# Patient Record
Sex: Female | Born: 1960 | Race: Black or African American | Hispanic: No | Marital: Single | Smoking: Current every day smoker
Health system: Southern US, Community
[De-identification: ages and names within clinical notes are randomized; demographics above are authoritative.]

## PROBLEM LIST (undated history)

## (undated) DIAGNOSIS — K649 Unspecified hemorrhoids: Secondary | ICD-10-CM

## (undated) DIAGNOSIS — A749 Chlamydial infection, unspecified: Secondary | ICD-10-CM

## (undated) HISTORY — PX: OTHER SURGICAL HISTORY: SHX169

---

## 1997-08-06 ENCOUNTER — Emergency Department (HOSPITAL_COMMUNITY): Admission: EM | Admit: 1997-08-06 | Discharge: 1997-08-06 | Payer: Self-pay | Admitting: Emergency Medicine

## 1997-08-17 ENCOUNTER — Inpatient Hospital Stay (HOSPITAL_COMMUNITY): Admission: AD | Admit: 1997-08-17 | Discharge: 1997-08-17 | Payer: Self-pay | Admitting: Obstetrics & Gynecology

## 1997-10-22 ENCOUNTER — Ambulatory Visit (HOSPITAL_COMMUNITY): Admission: RE | Admit: 1997-10-22 | Discharge: 1997-10-22 | Payer: Self-pay | Admitting: Family Medicine

## 1997-11-25 ENCOUNTER — Emergency Department (HOSPITAL_COMMUNITY): Admission: EM | Admit: 1997-11-25 | Discharge: 1997-11-25 | Payer: Self-pay | Admitting: Emergency Medicine

## 1997-11-25 ENCOUNTER — Encounter: Payer: Self-pay | Admitting: Emergency Medicine

## 1998-01-01 ENCOUNTER — Emergency Department (HOSPITAL_COMMUNITY): Admission: EM | Admit: 1998-01-01 | Discharge: 1998-01-02 | Payer: Self-pay | Admitting: Emergency Medicine

## 1998-01-02 ENCOUNTER — Encounter: Payer: Self-pay | Admitting: Emergency Medicine

## 1998-01-24 ENCOUNTER — Encounter: Admission: RE | Admit: 1998-01-24 | Discharge: 1998-04-24 | Payer: Self-pay | Admitting: Family Medicine

## 1998-04-18 ENCOUNTER — Emergency Department (HOSPITAL_COMMUNITY): Admission: EM | Admit: 1998-04-18 | Discharge: 1998-04-18 | Payer: Self-pay | Admitting: Emergency Medicine

## 1998-04-23 ENCOUNTER — Emergency Department (HOSPITAL_COMMUNITY): Admission: EM | Admit: 1998-04-23 | Discharge: 1998-04-23 | Payer: Self-pay | Admitting: Emergency Medicine

## 1998-05-27 ENCOUNTER — Emergency Department (HOSPITAL_COMMUNITY): Admission: EM | Admit: 1998-05-27 | Discharge: 1998-05-27 | Payer: Self-pay | Admitting: Emergency Medicine

## 1999-03-18 ENCOUNTER — Emergency Department (HOSPITAL_COMMUNITY): Admission: EM | Admit: 1999-03-18 | Discharge: 1999-03-18 | Payer: Self-pay | Admitting: Emergency Medicine

## 1999-04-13 ENCOUNTER — Emergency Department (HOSPITAL_COMMUNITY): Admission: EM | Admit: 1999-04-13 | Discharge: 1999-04-13 | Payer: Self-pay | Admitting: Emergency Medicine

## 1999-04-14 ENCOUNTER — Emergency Department (HOSPITAL_COMMUNITY): Admission: EM | Admit: 1999-04-14 | Discharge: 1999-04-14 | Payer: Self-pay | Admitting: Emergency Medicine

## 1999-04-15 ENCOUNTER — Emergency Department (HOSPITAL_COMMUNITY): Admission: EM | Admit: 1999-04-15 | Discharge: 1999-04-15 | Payer: Self-pay | Admitting: Emergency Medicine

## 1999-05-22 ENCOUNTER — Emergency Department (HOSPITAL_COMMUNITY): Admission: EM | Admit: 1999-05-22 | Discharge: 1999-05-22 | Payer: Self-pay | Admitting: Emergency Medicine

## 1999-07-16 ENCOUNTER — Emergency Department (HOSPITAL_COMMUNITY): Admission: EM | Admit: 1999-07-16 | Discharge: 1999-07-16 | Payer: Self-pay | Admitting: Emergency Medicine

## 1999-09-06 ENCOUNTER — Emergency Department (HOSPITAL_COMMUNITY): Admission: EM | Admit: 1999-09-06 | Discharge: 1999-09-07 | Payer: Self-pay

## 1999-09-07 ENCOUNTER — Encounter: Payer: Self-pay | Admitting: Emergency Medicine

## 2000-03-29 ENCOUNTER — Emergency Department (HOSPITAL_COMMUNITY): Admission: EM | Admit: 2000-03-29 | Discharge: 2000-03-29 | Payer: Self-pay | Admitting: Emergency Medicine

## 2001-05-18 ENCOUNTER — Emergency Department (HOSPITAL_COMMUNITY): Admission: EM | Admit: 2001-05-18 | Discharge: 2001-05-18 | Payer: Self-pay | Admitting: Emergency Medicine

## 2001-10-26 ENCOUNTER — Emergency Department (HOSPITAL_COMMUNITY): Admission: EM | Admit: 2001-10-26 | Discharge: 2001-10-26 | Payer: Self-pay | Admitting: *Deleted

## 2001-11-03 ENCOUNTER — Emergency Department (HOSPITAL_COMMUNITY): Admission: EM | Admit: 2001-11-03 | Discharge: 2001-11-03 | Payer: Self-pay | Admitting: Emergency Medicine

## 2001-11-03 ENCOUNTER — Encounter: Payer: Self-pay | Admitting: Emergency Medicine

## 2002-05-15 ENCOUNTER — Emergency Department (HOSPITAL_COMMUNITY): Admission: EM | Admit: 2002-05-15 | Discharge: 2002-05-15 | Payer: Self-pay | Admitting: Emergency Medicine

## 2002-05-15 ENCOUNTER — Encounter: Payer: Self-pay | Admitting: Emergency Medicine

## 2002-07-11 ENCOUNTER — Emergency Department (HOSPITAL_COMMUNITY): Admission: EM | Admit: 2002-07-11 | Discharge: 2002-07-11 | Payer: Self-pay | Admitting: Emergency Medicine

## 2003-10-09 ENCOUNTER — Emergency Department (HOSPITAL_COMMUNITY): Admission: EM | Admit: 2003-10-09 | Discharge: 2003-10-09 | Payer: Self-pay | Admitting: Emergency Medicine

## 2005-01-07 ENCOUNTER — Emergency Department (HOSPITAL_COMMUNITY): Admission: EM | Admit: 2005-01-07 | Discharge: 2005-01-07 | Payer: Self-pay | Admitting: Emergency Medicine

## 2007-06-23 ENCOUNTER — Emergency Department (HOSPITAL_COMMUNITY): Admission: EM | Admit: 2007-06-23 | Discharge: 2007-06-24 | Payer: Self-pay | Admitting: Emergency Medicine

## 2007-06-28 ENCOUNTER — Emergency Department (HOSPITAL_COMMUNITY): Admission: EM | Admit: 2007-06-28 | Discharge: 2007-06-28 | Payer: Self-pay | Admitting: Emergency Medicine

## 2008-04-25 ENCOUNTER — Emergency Department (HOSPITAL_COMMUNITY): Admission: EM | Admit: 2008-04-25 | Discharge: 2008-04-25 | Payer: Self-pay | Admitting: Emergency Medicine

## 2009-04-09 ENCOUNTER — Emergency Department (HOSPITAL_COMMUNITY): Admission: EM | Admit: 2009-04-09 | Discharge: 2009-04-09 | Payer: Self-pay | Admitting: Emergency Medicine

## 2009-07-16 ENCOUNTER — Ambulatory Visit: Payer: Self-pay | Admitting: Internal Medicine

## 2009-07-16 ENCOUNTER — Encounter (INDEPENDENT_AMBULATORY_CARE_PROVIDER_SITE_OTHER): Payer: Self-pay | Admitting: Family Medicine

## 2009-07-16 LAB — CONVERTED CEMR LAB
ALT: 11 units/L (ref 0–35)
AST: 12 units/L (ref 0–37)
Alkaline Phosphatase: 54 units/L (ref 39–117)
Basophils Absolute: 0 10*3/uL (ref 0.0–0.1)
Basophils Relative: 1 % (ref 0–1)
CRP: 0.6 mg/dL — ABNORMAL HIGH (ref <0.6)
Calcium: 8.8 mg/dL (ref 8.4–10.5)
HCT: 41 % (ref 36.0–46.0)
Hemoglobin: 14 g/dL (ref 12.0–15.0)
Lymphs Abs: 3 10*3/uL (ref 0.7–4.0)
MCV: 87.6 fL (ref 78.0–100.0)
Monocytes Relative: 11 % (ref 3–12)
Neutro Abs: 1.5 10*3/uL — ABNORMAL LOW (ref 1.7–7.7)
Neutrophils Relative %: 28 % — ABNORMAL LOW (ref 43–77)
TSH: 2.588 microintl units/mL (ref 0.350–4.500)
Total Bilirubin: 0.2 mg/dL — ABNORMAL LOW (ref 0.3–1.2)
Total Protein: 6.9 g/dL (ref 6.0–8.3)
Vit D, 25-Hydroxy: 16 ng/mL — ABNORMAL LOW (ref 30–89)
WBC: 5.2 10*3/uL (ref 4.0–10.5)

## 2009-07-24 ENCOUNTER — Encounter: Admission: RE | Admit: 2009-07-24 | Discharge: 2009-07-24 | Payer: Self-pay | Admitting: Family Medicine

## 2010-04-06 ENCOUNTER — Encounter: Payer: Self-pay | Admitting: Family Medicine

## 2010-07-29 NOTE — Op Note (Signed)
NAME:  Joanna Phelps, PETREY NO.:  [id number]   MEDICAL RECORD NO.:  [id number]          PATIENT TYPE:  EMS   LOCATION:  ED                           FACILITY:  [facility]   PHYSICIAN:  Thornton Park. Daphine Deutscher, MD  DATE OF BIRTH:  05-Jul-1960   DATE OF PROCEDURE:  06/28/2007  DATE OF DISCHARGE:  06/28/2007                               OPERATIVE REPORT   PREOPERATIVE DIAGNOSIS:  Left perirectal abscess.   POSTOPERATIVE DIAGNOSIS:  Left perirectal abscess.   The patient was seen by me in the ED with [place], PA.  The area was  prepped the left side.  It was a fluctuant area sort of anterior.  This  was painted with Betadine and injected with lidocaine and incised about  1.5 cm in length.  A copious amount of foul-smelling pus escaped.  The  patient will be placed on Augmentin and will be given something for pain  and I offered to see her back in the office.   IMPRESSION:  Perirectal abscess, status post incision and drainage.  Return to the office in about a week.  She will be given Augmentin and  something for pain.      Thornton Park Daphine Deutscher, MD  Electronically Signed     MBM/MEDQ  D:  06/28/2007  T:  06/29/2007  Job:  [id number]

## 2010-11-29 ENCOUNTER — Emergency Department (HOSPITAL_COMMUNITY)
Admission: EM | Admit: 2010-11-29 | Discharge: 2010-11-29 | Disposition: A | Discharge status: Home / Self Care | Payer: Self-pay | Attending: Emergency Medicine | Admitting: Emergency Medicine

## 2010-11-29 DIAGNOSIS — R002 Palpitations: Secondary | ICD-10-CM | POA: Insufficient documentation

## 2010-11-29 DIAGNOSIS — R0602 Shortness of breath: Secondary | ICD-10-CM | POA: Insufficient documentation

## 2010-11-29 LAB — POCT I-STAT, CHEM 8
Calcium, Ion: 1.15 mmol/L (ref 1.12–1.32)
Chloride: 105 mEq/L (ref 96–112)
Creatinine, Ser: 0.9 mg/dL (ref 0.50–1.10)
HCT: 44 % (ref 36.0–46.0)
Hemoglobin: 15 g/dL (ref 12.0–15.0)
Potassium: 4 mEq/L (ref 3.5–5.1)
Sodium: 139 mEq/L (ref 135–145)
TCO2: 21 mmol/L (ref 0–100)

## 2010-11-29 LAB — CBC
HCT: 39.2 % (ref 36.0–46.0)
Hemoglobin: 13.5 g/dL (ref 12.0–15.0)
MCH: 29.1 pg (ref 26.0–34.0)
RBC: 4.64 MIL/uL (ref 3.87–5.11)
RDW: 14.7 % (ref 11.5–15.5)
WBC: 4.9 10*3/uL (ref 4.0–10.5)

## 2010-11-29 LAB — POCT I-STAT TROPONIN I: Troponin i, poc: 0 ng/mL (ref 0.00–0.08)

## 2010-11-29 LAB — DIFFERENTIAL
Basophils Relative: 1 % (ref 0–1)
Eosinophils Relative: 2 % (ref 0–5)

## 2010-11-29 LAB — D-DIMER, QUANTITATIVE: D-Dimer, Quant: 0.27 ug/mL-FEU (ref 0.00–0.48)

## 2011-03-09 ENCOUNTER — Encounter: Payer: Self-pay | Admitting: Emergency Medicine

## 2011-03-09 ENCOUNTER — Other Ambulatory Visit: Payer: Self-pay

## 2011-03-09 ENCOUNTER — Emergency Department (HOSPITAL_COMMUNITY)
Admission: EM | Admit: 2011-03-09 | Discharge: 2011-03-09 | Disposition: A | Discharge status: Home / Self Care | Payer: Self-pay | Attending: Emergency Medicine | Admitting: Emergency Medicine

## 2011-03-09 DIAGNOSIS — I493 Ventricular premature depolarization: Secondary | ICD-10-CM

## 2011-03-09 DIAGNOSIS — R739 Hyperglycemia, unspecified: Secondary | ICD-10-CM

## 2011-03-09 DIAGNOSIS — R7309 Other abnormal glucose: Secondary | ICD-10-CM | POA: Insufficient documentation

## 2011-03-09 DIAGNOSIS — I4949 Other premature depolarization: Secondary | ICD-10-CM | POA: Insufficient documentation

## 2011-03-09 DIAGNOSIS — R002 Palpitations: Secondary | ICD-10-CM | POA: Insufficient documentation

## 2011-03-09 DIAGNOSIS — E876 Hypokalemia: Secondary | ICD-10-CM | POA: Insufficient documentation

## 2011-03-09 LAB — BASIC METABOLIC PANEL
CO2: 22 mEq/L (ref 19–32)
Calcium: 8.8 mg/dL (ref 8.4–10.5)
GFR calc Af Amer: 90 mL/min — ABNORMAL LOW (ref >90)
Glucose, Bld: 148 mg/dL — ABNORMAL HIGH (ref 70–99)

## 2011-03-09 MED ORDER — POTASSIUM CHLORIDE CRYS ER 20 MEQ PO TBCR
40.0000 meq | EXTENDED_RELEASE_TABLET | Freq: Once | ORAL | Status: AC
  Administered 2011-03-09: 40 meq via ORAL
  Filled 2011-03-09: qty 2

## 2011-03-09 MED ORDER — POTASSIUM CHLORIDE CRYS ER 20 MEQ PO TBCR: 20.0000 meq | EXTENDED_RELEASE_TABLET | Freq: Two times a day (BID) | ORAL | Status: DC

## 2011-03-09 MED ORDER — POTASSIUM CHLORIDE ER 10 MEQ PO TBCR: 20.0000 meq | EXTENDED_RELEASE_TABLET | Freq: Two times a day (BID) | ORAL | Status: DC

## 2011-03-09 NOTE — ED Provider Notes (Signed)
History     CSN: [id number]  Arrival date & time 03/09/11  4098   First MD Initiated Contact with Patient 03/09/11 0301      Chief Complaint  Patient presents with  . Palpitations    patient stated she felt like heart beating real hard and fast so she came to ER    (Consider location/radiation/quality/duration/timing/severity/associated sxs/prior treatment) Patient is a 50 y.o. female presenting with palpitations. The history is provided by the patient.  Palpitations   She has been complaining of palpitations for the last 2-3 months. Her last week, they have been more frequent. Palpitations consists of a feeling of a pounding in her chest which is momentary but sometimes will occur several back to back. She denies any chest pain, heaviness, tightness or pressure. She denies any dyspnea, nausea, vomiting, or diaphoresis. No palpitations can occur both at rest and when she is up and around and walking. She was seen in the emergency department 2 months ago and referred to a cardiologist but could not afford the money that the cardiologist was going to charge. She's been trying to get into one of the low cost clinics in town but has not been able to get established yet  History reviewed. No pertinent past medical history.  History reviewed. No pertinent past surgical history.  History reviewed. No pertinent family history.  History  Substance Use Topics  . Smoking status: Current Everyday Smoker  . Smokeless tobacco: Not on file  . Alcohol Use: No    OB History    Grav Para Term Preterm Abortions TAB SAB Ect Mult Living                  Review of Systems  Cardiovascular: Positive for palpitations.  All other systems reviewed and are negative.    Allergies  Ibuprofen  Home Medications  No current outpatient prescriptions on file.  BP 139/74  Pulse 80  Temp(Src) 97.4 F (36.3 C) (Oral)  Resp 20  Ht 5\' 6"  (1.676 m)  Wt 220 lb (99.791 kg)  BMI 35.51 kg/m2  SpO2  99%  LMP 03/02/2011  Physical Exam  Nursing note and vitals reviewed.  50 year old female is resting comfortably and in no acute distress. Vital signs are normal. Oxygen saturation is 99% which is normal. Head is normocephalic and atraumatic. PERRLA, EOMI. Oropharynx is clear. Neck is supple without adenopathy or JVD. Lungs are clear without rales, wheezes, rhonchi. Back is nontender. Heart has regular rate and rhythm without murmur. Abdomen is soft, flat, nontender without masses or hepatosplenomegaly peristalsis is normal active. Extremities have no cyanosis or edema. Neurologic: Mental status is normal, cranial nerves are intact, there no focal motor or sensory deficits. Deep tendon reflexes are symmetric. Psychiatric: No abnormalities of mood or affect.  ED Course  Procedures (including critical care time)   Labs Reviewed  BASIC METABOLIC PANEL  TROPONIN I   Results for orders placed during the hospital encounter of 03/09/11  BASIC METABOLIC PANEL      Component Value Range   Sodium 136  135 - 145 (mEq/L)   Potassium 3.4 (*) 3.5 - 5.1 (mEq/L)   Chloride 103  96 - 112 (mEq/L)   CO2 22  19 - 32 (mEq/L)   Glucose, Bld 148 (*) 70 - 99 (mg/dL)   BUN 14  6 - 23 (mg/dL)   Creatinine, Ser 1.19  0.50 - 1.10 (mg/dL)   Calcium 8.8  8.4 - 14.7 (mg/dL)   GFR calc non  Af Amer 78 (*) >90 (mL/min)   GFR calc Af Amer 90 (*) >90 (mL/min)  TROPONIN I      Component Value Range   Troponin I <0.30  <0.30 (ng/mL)   No results found.   No results found.   Date: 03/09/2011  Rate: 81  Rhythm: normal sinus rhythm and premature ventricular contractions (PVC)  QRS Axis: normal  Intervals: normal  ST/T Wave abnormalities: normal  Conduction Disutrbances:none  Narrative Interpretation: Normal ECG except for her isolated PVC. When compared with ECG of 11/29/2010, no significant change is noted.  Old EKG Reviewed: none available   No diagnosis found.  While I was out talking with her, the  monitor would show an occasional PVC. Patient stated that she would feel palpitations and they coincided exactly with when PVCs were seen on the monitor. I saw no evidence of complex ventricular ectopy. All the PVCs have the same morphology and there were no couplets or triplets and no episodes of bigeminy or trigeminy.  Potassium is noted to be borderline low. She will be given a prescription for oral potassium supplementation for the next week. Blood sugar is also borderline and she is advised that this needs to be monitored as she may be prediabetic or diabetic. MDM  Symptomatic PVCs.        Dione Booze, MD 03/09/11 [phone]

## 2013-11-03 ENCOUNTER — Emergency Department (INDEPENDENT_AMBULATORY_CARE_PROVIDER_SITE_OTHER)
Admission: EM | Admit: 2013-11-03 | Discharge: 2013-11-03 | Disposition: A | Discharge status: Home / Self Care | Payer: No Typology Code available for payment source | Source: Home / Self Care | Attending: Emergency Medicine | Admitting: Emergency Medicine

## 2013-11-03 ENCOUNTER — Encounter (HOSPITAL_COMMUNITY): Payer: Self-pay | Admitting: Emergency Medicine

## 2013-11-03 DIAGNOSIS — K644 Residual hemorrhoidal skin tags: Secondary | ICD-10-CM

## 2013-11-03 MED ORDER — LIDOCAINE-HYDROCORTISONE ACE 2.8-0.55 % RE GEL: 1.0000 | Freq: Three times a day (TID) | RECTAL | Status: DC | PRN

## 2013-11-03 MED ORDER — SULFAMETHOXAZOLE-TMP DS 800-160 MG PO TABS
1.0000 | ORAL_TABLET | Freq: Two times a day (BID) | ORAL | Status: DC
Start: 2013-11-03 — End: 2014-04-19

## 2013-11-03 MED ORDER — HYDROCODONE-ACETAMINOPHEN 5-325 MG PO TABS: 1.0000 | ORAL_TABLET | Freq: Four times a day (QID) | ORAL | Status: DC | PRN

## 2013-11-03 NOTE — Discharge Instructions (Signed)
You have several external hemorrhoids. It also looks like the infection you had before is starting to come back.  Take the Bactrim 1 pill twice a day for 10 days. Apply the gel 3 times daily as needed for pain. Take Norco as needed for pain.  Do not drive or work on this medication. Do sitz baths 2-3 times a day.  Follow up with general surgery in the next 1-2 weeks.

## 2013-11-03 NOTE — ED Notes (Signed)
ACCESSED RECORD FOR [facility]ACIST.  NO CHANGES MADE

## 2013-11-03 NOTE — ED Notes (Signed)
C/o hemmorrhoids.  No relief with preparation H.  States "It seemed to irritate me".   Denies blood in stool or any other symptoms.

## 2013-11-03 NOTE — ED Provider Notes (Signed)
CSN: [id number]     Arrival date & time 11/03/13  1342 History   First MD Initiated Contact with Patient 11/03/13 1452     Chief Complaint  Patient presents with  . Hemorrhoids   (Consider location/radiation/quality/duration/timing/severity/associated sxs/prior Treatment) HPI She is a 53 year old woman here for evaluation of hemorrhoids. She reports straining and constipation over the weekend. On Tuesday, she developed rectal pain and felt a hemorrhoid. She has had hemorrhoids in the past, and this feels similar. She's been using preparation H. with out improvement. She denies any blood in her stool. She reports her constipation has since resolved. She also had what sounds like a perirectal abscess in the past, that was drained. She has a lingering nodule bear, which she says is more swollen and tender the last few days. No fevers or chills.  History reviewed. No pertinent past medical history. History reviewed. No pertinent past surgical history. History reviewed. No pertinent family history. History  Substance Use Topics  . Smoking status: Current Every Day Smoker  . Smokeless tobacco: Not on file  . Alcohol Use: No   OB History   Grav Para Term Preterm Abortions TAB SAB Ect Mult Living                 Review of Systems  Constitutional: Negative.   Gastrointestinal: Positive for constipation and rectal pain.    Allergies  Ibuprofen  Home Medications   Prior to Admission medications   Medication Sig Start Date End Date Taking? Authorizing Provider  Aspirin-Acetaminophen-Caffeine (GOODY HEADACHE PO) Take 1 Package by mouth daily as needed. For pain     Historical Provider, MD  HYDROcodone-acetaminophen (NORCO) 5-325 MG per tablet Take 1 tablet by mouth every 6 (six) hours as needed for moderate pain or severe pain. 11/03/13   Charm RingsErin J Tangie Stay, MD  Lidocaine-Hydrocortisone Ace 2.8-0.55 % GEL Place 1 applicator rectally 3 (three) times daily as needed (pain). 11/03/13   Charm RingsErin J Ayomide Zuleta,  MD  potassium chloride SA (K-DUR,KLOR-CON) 20 MEQ tablet Take 1 tablet (20 mEq total) by mouth 2 (two) times daily. 03/09/11 03/08/12  Dione Boozeavid Glick, MD  sulfamethoxazole-trimethoprim (BACTRIM DS) 800-160 MG per tablet Take 1 tablet by mouth 2 (two) times daily. 11/03/13   Charm RingsErin J Leandra Vanderweele, MD   BP 148/94  Pulse 68  Temp(Src) 99.2 F (37.3 C) (Oral)  Resp 16  SpO2 100%  LMP 10/31/2013 Physical Exam  Constitutional: She is oriented to person, place, and time. She appears well-developed and well-nourished. She appears distressed (uncomfortable sitting on chair).  Cardiovascular: Normal rate.   Pulmonary/Chest: Effort normal.  Genitourinary:  2-3 external hemorrhoids noted; one at 6 o'clock is tender but not swollen or thrombosed.  Also with peri-rectal, tender, erythematous nodule at 7 o'clock, no fluctuance.  Neurological: She is alert and oriented to person, place, and time.    ED Course  Procedures (including critical care time) Labs Review Labs Reviewed - No data to display  Imaging Review No results found.   MDM   1. External hemorrhoids    She does have external hemorrhoids. None of these are currently thrombosed. I am concerned that she is developing a perirectal abscess. She has a tender, erythematous nodule. There is no fluctuance. Will treat hemorrhoids with topical lidocaine-hydrocortisone cream. Will start Bactrim double strength for 10 days. Norco 5/325 mg, #15 tablets provided for severe pain. Discussed high fiber diet and importance of regular bowel movements. Discussed MiraLAX and Colace as needed for one soft bowel movement  a day. Recommended followup with [facility] in the next one to 2 weeks for definitive management.    Charm Rings, MD 11/03/13 1536

## 2013-11-06 ENCOUNTER — Encounter (HOSPITAL_COMMUNITY): Payer: Self-pay | Admitting: *Deleted

## 2013-11-06 ENCOUNTER — Inpatient Hospital Stay (HOSPITAL_COMMUNITY)
Admission: AD | Admit: 2013-11-06 | Discharge: 2013-11-06 | Disposition: A | Discharge status: Home / Self Care | Payer: No Typology Code available for payment source | Source: Ambulatory Visit | Attending: Obstetrics and Gynecology | Admitting: Obstetrics and Gynecology

## 2013-11-06 DIAGNOSIS — K612 Anorectal abscess: Secondary | ICD-10-CM | POA: Insufficient documentation

## 2013-11-06 DIAGNOSIS — L0233 Carbuncle of buttock: Secondary | ICD-10-CM | POA: Insufficient documentation

## 2013-11-06 DIAGNOSIS — F17200 Nicotine dependence, unspecified, uncomplicated: Secondary | ICD-10-CM | POA: Diagnosis not present

## 2013-11-06 DIAGNOSIS — K611 Rectal abscess: Secondary | ICD-10-CM

## 2013-11-06 HISTORY — DX: Unspecified hemorrhoids: K64.9

## 2013-11-06 HISTORY — DX: Chlamydial infection, unspecified: A74.9

## 2013-11-06 LAB — POCT PREGNANCY, URINE: Preg Test, Ur: NEGATIVE

## 2013-11-06 MED ORDER — HYDROCODONE-ACETAMINOPHEN 5-325 MG PO TABS: 1.0000 | ORAL_TABLET | Freq: Four times a day (QID) | ORAL | Status: DC | PRN

## 2013-11-06 MED ORDER — PROMETHAZINE HCL 25 MG PO TABS
25.0000 mg | ORAL_TABLET | Freq: Once | ORAL | Status: AC
  Administered 2013-11-06: 25 mg via ORAL
  Filled 2013-11-06: qty 1

## 2013-11-06 MED ORDER — OXYCODONE-ACETAMINOPHEN 5-325 MG PO TABS
1.0000 | ORAL_TABLET | Freq: Once | ORAL | Status: AC
  Administered 2013-11-06: 1 via ORAL
  Filled 2013-11-06: qty 1

## 2013-11-06 NOTE — MAU Note (Signed)
Pregnancy test done and urine in lab

## 2013-11-06 NOTE — MAU Provider Note (Signed)
History     CSN: [id number]  Arrival date and time: 11/06/13 1535   First Provider Initiated Contact with Patient 11/06/13 1645      Chief Complaint  Patient presents with  . Hemorrhoids   HPI Comments: Joanna Phelps 53 y.o. G0P0 presents to MAU with boil on her right buttock. She was seen at [facility] on 11/03/13 for hemorrhoids and was noticed to have a perirectal abscess at that time. She was put on Septra and [place] and was doing ok until today when the abscess came to a head and broke open. She had much less pain then. She states the odor almost made her vomit. She has no fever. She has an appointment in early September at [facility] for her hemorrhoids. She does not have PCP due to lack of insurance until recently.     Past Medical History  Diagnosis Date  . Hemorrhoids   . Chlamydia     Past Surgical History  Procedure Laterality Date  . Boil lanced      History reviewed. No pertinent family history.  History  Substance Use Topics  . Smoking status: Current Every Day Smoker    Types: Cigarettes  . Smokeless tobacco: Not on file  . Alcohol Use: No    Allergies:  Allergies  Allergen Reactions  . Ibuprofen Itching and Rash    Prescriptions prior to admission  Medication Sig Dispense Refill  . acetaminophen (TYLENOL) 500 MG tablet Take 1,000 mg by mouth every 6 (six) hours as needed for moderate pain.      Marland Kitchen HYDROcodone-acetaminophen (NORCO) 5-325 MG per tablet Take 1 tablet by mouth every 6 (six) hours as needed for moderate pain or severe pain.  10 tablet  0  . Rectal Protectant-Emollient (HEMORRHOIDAL) OINT Place 1 application rectally 3 (three) times daily as needed (pain).      Marland Kitchen sulfamethoxazole-trimethoprim (BACTRIM DS) 800-160 MG per tablet Take 1 tablet by mouth 2 (two) times daily.  20 tablet  0    Review of Systems  Constitutional: Negative.   HENT: Negative.   Eyes: Negative.   Respiratory: Negative.   Cardiovascular: Negative.    Gastrointestinal: Negative.   Genitourinary: Negative.   Musculoskeletal: Negative.   Skin:       Boil on right buttocks  Neurological: Negative.   Psychiatric/Behavioral: Negative.    Physical Exam   Blood pressure 131/83, pulse 80, temperature 98.4 F (36.9 C), temperature source Oral, resp. rate 18, height  (1.702 m), weight 93.895 kg (207 lb), last menstrual period 10/31/2013.  Physical Exam  Constitutional: She is oriented to person, place, and time. She appears well-developed and well-nourished. No distress.  HENT:  Head: Normocephalic and atraumatic.  Eyes: Conjunctivae are normal.  Cardiovascular: Normal rate, regular rhythm and normal heart sounds.   Respiratory: Effort normal and breath sounds normal.  GI: Soft. Bowel sounds are normal. She exhibits no distension. There is no tenderness. There is no rebound.  Genitourinary:  Right peri rectal area there is an approximately 5cm mostly drained abscess. Area was cleaned with betadine.  A small incision was made to allow for complete drainage. Small amount serous drainage. Sterile packing applied  Neurological: She is alert and oriented to person, place, and time.  Skin: Skin is warm and dry.  Psychiatric: She has a normal mood and affect. Her behavior is normal. Judgment and thought content normal.    MAU Course  Procedures  MDM  Percocet/ phenergan given for pain  Assessment and Plan   A: Perirectal abscess  P: I&D of abscess Continue Septra Percocet for pain Message [facility] for an earlier appointment Return to MAU with pain/ fever Advised to get PCP   Blessing Zaucha, Rubbie Battiest 11/06/2013, 7:19 PM

## 2013-11-06 NOTE — MAU Note (Addendum)
C/O hemorrhoid and "boil." Has appointment with [facility], but boil broke open and now is draining. Having a lot of pain.

## 2013-11-06 NOTE — Discharge Instructions (Signed)
Peri-Rectal Abscess  Your caregiver has diagnosed you as having a peri-rectal abscess. This is an infected area near the rectum that is filled with pus. If the abscess is near the surface of the skin, your caregiver may open (incise) the area and drain the pus.  HOME CARE INSTRUCTIONS    If your abscess was opened up and drained. A small piece of gauze may be placed in the opening so that it can drain. Do not remove the gauze unless directed by your caregiver.   A loose dressing may be placed over the abscess site. Change the dressing as often as necessary to keep it clean and dry.   After the drain is removed, the area may be washed with a gentle antiseptic (soap) four times per day.   A warm sitz bath, warm packs or heating pad may be used for pain relief, taking care not to burn yourself.   Return for a wound check in 1 day or as directed.   An "inflatable doughnut" may be used for sitting with added comfort. These can be purchased at a drugstore or medical supply house.   To reduce pain and straining with bowel movements, eat a high fiber diet with plenty of fruits and vegetables. Use stool softeners as recommended by your caregiver. This is especially important if narcotic type pain medications were prescribed as these may cause marked constipation.   Only take over-the-counter or prescription medicines for pain, discomfort, or fever as directed by your caregiver.  SEEK IMMEDIATE MEDICAL CARE IF:    You have increasing pain that is not controlled by medication.   There is increased inflammation (redness), swelling, bleeding, or drainage from the area.   An oral temperature above 102 F (38.9 C) develops.   You develop chills or generalized malaise (feel lethargic or feel "washed out").   You develop any new symptoms (problems) you feel may be related to your present problem.  Document Released: 02/28/2000 Document Revised: 05/25/2011 Document Reviewed: 02/28/2008  ExitCare Patient Information  2015 [employer]. This information is not intended to replace advice given to you by your health care provider. Make sure you discuss any questions you have with your health care provider.

## 2013-11-06 NOTE — MAU Note (Signed)
Pt states was seen at [facility] Friday for hemorrhoids and ?abscess. Made appt to be seen at [facility] however unable to be seen until 11/17/2013. States was given rx's including pain relief cream, however couldn't afford $200 cream. States area busted and had foul odor.

## 2013-11-07 NOTE — MAU Provider Note (Signed)
Attestation of Attending Supervision of Advanced Practitioner (CNM/NP): Evaluation and management procedures were performed by the Advanced Practitioner under my supervision and collaboration.  I have reviewed the Advanced Practitioner's note and chart, and I agree with the management and plan.  Makael Stein 11/07/2013 1:27 AM

## 2013-11-21 ENCOUNTER — Ambulatory Visit (INDEPENDENT_AMBULATORY_CARE_PROVIDER_SITE_OTHER): Payer: No Typology Code available for payment source | Admitting: General Surgery

## 2014-04-19 ENCOUNTER — Emergency Department (HOSPITAL_COMMUNITY)
Admission: EM | Admit: 2014-04-19 | Discharge: 2014-04-19 | Disposition: A | Discharge status: Home / Self Care | Payer: 59 | Source: Home / Self Care | Attending: Emergency Medicine | Admitting: Emergency Medicine

## 2014-04-19 ENCOUNTER — Encounter (HOSPITAL_COMMUNITY): Payer: Self-pay | Admitting: Emergency Medicine

## 2014-04-19 DIAGNOSIS — M791 Myalgia, unspecified site: Secondary | ICD-10-CM

## 2014-04-19 DIAGNOSIS — M546 Pain in thoracic spine: Secondary | ICD-10-CM

## 2014-04-19 MED ORDER — DICLOFENAC SODIUM 1 % TD GEL: 1.0000 "application " | Freq: Four times a day (QID) | TRANSDERMAL | Status: DC

## 2014-04-19 MED ORDER — TRAMADOL HCL 50 MG PO TABS: 50.0000 mg | ORAL_TABLET | Freq: Four times a day (QID) | ORAL | Status: DC | PRN

## 2014-04-19 NOTE — ED Provider Notes (Signed)
CSN: [id number]     Arrival date & time 04/19/14  1333 History   First MD Initiated Contact with Patient 04/19/14 1410     Chief Complaint  Patient presents with  . Back Pain   (Consider location/radiation/quality/duration/timing/severity/associated sxs/prior Treatment) HPI Comments: 54 year old female complaining of right low back pain for approximate 5 days. She is uncertain as to the reason she developed this pain. 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 She also had been vomiting last Sunday one day preceding the onset of pain. Denies fall or any 1 specific known calls to cause back pain. The pain is worse with movement. Certain positions improve the pain.   Past Medical History  Diagnosis Date  . Hemorrhoids   . Chlamydia    Past Surgical History  Procedure Laterality Date  . Boil lanced     No family history on file. History  Substance Use Topics  . Smoking status: Current Every Day Smoker    Types: Cigarettes  . Smokeless tobacco: Not on file  . Alcohol Use: No   OB History    Gravida Para Term Preterm AB TAB SAB Ectopic Multiple Living   0         0     Review of Systems  Constitutional: Positive for activity change.  Respiratory: Negative for cough, shortness of breath and wheezing.   Cardiovascular: Negative.   Genitourinary: Negative.   Musculoskeletal: Positive for back pain. Negative for gait problem.  Neurological: Negative.     Allergies  Ibuprofen  Home Medications   Prior to Admission medications   Medication Sig Start Date End Date Taking? Authorizing Provider  acetaminophen (TYLENOL) 500 MG tablet Take 1,000 mg by mouth every 6 (six) hours as needed for moderate pain.    Historical Provider, MD  diclofenac sodium (VOLTAREN) 1 % GEL Apply 1 application topically 4 (four) times daily. 04/19/14   Hayden Rasmussenavid Pedrohenrique [place], NP  Rectal Protectant-Emollient (HEMORRHOIDAL) OINT Place 1 application rectally  3 (three) times daily as needed (pain).    Historical Provider, MD  traMADol (ULTRAM) 50 MG tablet Take 1 tablet (50 mg total) by mouth every 6 (six) hours as needed. 04/19/14   Hayden Rasmussenavid Torey Reinard, NP   BP 174/79 mmHg  Pulse 71  Temp(Src) 98.8 F (37.1 C) (Oral)  Resp 16  SpO2 99%  LMP 03/29/2014 Physical Exam  Constitutional: She is oriented to person, place, and time. She appears well-developed and well-nourished. No distress.  Pulmonary/Chest: Effort normal and breath sounds normal. No respiratory distress. She has no wheezes. She has no rales. She exhibits no tenderness.  Musculoskeletal:  Tenderness over the right para upper para lumbar and lower most thoracic paralumbar musculature . The area of tenderness is relatively small. Small areas of muscle tightness or "knots" can be palpated in the area of pain.  Neurological: She is alert and oriented to person, place, and time.  Skin: Skin is warm and dry.  Psychiatric: She has a normal mood and affect.  Nursing note and vitals reviewed.    ED Course  Procedures (including critical care time) Labs Review Labs Reviewed - No data to display  Imaging Review No results found.   MDM   1. Muscle pain   2. Right-sided thoracic back pain    * Diclofenac gel as directed Tramadol 50 mg #15 Apply heat as directed No work of movement that exacerbates the pain.   Hayden Rasmussenavid Talor Cheema, NP 04/19/14  1428 

## 2014-04-19 NOTE — Discharge Instructions (Signed)
Back Pain, Adult °Low back pain is very common. About 1 in 5 people have back pain. The cause of low back pain is rarely dangerous. The pain often gets better over time. About half of people with a sudden onset of back pain feel better in just 2 weeks. About 8 in 10 people feel better by 6 weeks.  °CAUSES °Some common causes of back pain include: °· Strain of the muscles or ligaments supporting the spine. °· Wear and tear (degeneration) of the spinal discs. °· Arthritis. °· Direct injury to the back. °DIAGNOSIS °Most of the time, the direct cause of low back pain is not known. However, back pain can be treated effectively even when the exact cause of the pain is unknown. Answering your caregiver's questions about your overall health and symptoms is one of the most accurate ways to make sure the cause of your pain is not dangerous. If your caregiver needs more information, he or she may order lab work or imaging tests (X-rays or MRIs). However, even if imaging tests show changes in your back, this usually does not require surgery. °HOME CARE INSTRUCTIONS °For many people, back pain returns. Since low back pain is rarely dangerous, it is often a condition that people can learn to manage on their own.  °· Remain active. It is stressful on the back to sit or stand in one place. Do not sit, drive, or stand in one place for more than 30 minutes at a time. Take short walks on level surfaces as soon as pain allows. Try to increase the length of time you walk each day. °· Do not stay in bed. Resting more than 1 or 2 days can delay your recovery. °· Do not avoid exercise or work. Your body is made to move. It is not dangerous to be active, even though your back may hurt. Your back will likely heal faster if you return to being active before your pain is gone. °· Pay attention to your body when you  bend and lift. Many people have less discomfort when lifting if they bend their knees, keep the load close to their bodies, and  avoid twisting. Often, the most comfortable positions are those that put less stress on your recovering back. °· Find a comfortable position to sleep. Use a firm mattress and lie on your side with your knees slightly bent. If you lie on your back, put a pillow under your knees. °· Only take over-the-counter or prescription medicines as directed by your caregiver. Over-the-counter medicines to reduce pain and inflammation are often the most helpful. Your caregiver may prescribe muscle relaxant drugs. These medicines help dull your pain so you can more quickly return to your normal activities and healthy exercise. °· Put ice on the injured area. °¨ Put ice in a plastic bag. °¨ Place a towel between your skin and the bag. °¨ Leave the ice on for 15-20 minutes, 03-04 times a day for the first 2 to 3 days. After that, ice and heat may be alternated to reduce pain and spasms. °· Ask your caregiver about trying back exercises and gentle massage. This may be of some benefit. °· Avoid feeling anxious or stressed. Stress increases muscle tension and can worsen back pain. It is important to recognize when you are anxious or stressed and learn ways to manage it. Exercise is a great option. °SEEK MEDICAL CARE IF: °· You have pain that is not relieved with rest or medicine. °· You have pain that does not improve in 1 week. °· You have new symptoms. °· You are generally not feeling well. °SEEK   IMMEDIATE MEDICAL CARE IF:   You have pain that radiates from your back into your legs.  You develop new bowel or bladder control problems.  You have unusual weakness or numbness in your arms or legs.  You develop nausea or vomiting.  You develop abdominal pain.  You feel faint. Document Released: 03/02/2005 Document Revised: 09/01/2011 Document Reviewed: 07/04/2013 [facility] Patient Information 2015 [place], [place]. This information is not intended to replace advice given to you by your health care provider. Make sure you  discuss any questions you have with your health care provider.  Muscle Pain Muscle pain (myalgia) may be caused by many things, including:  Overuse or muscle strain, especially if you are not in shape. This is the most common cause of muscle pain.  Injury.  Bruises.  Viruses, such as the flu.  Infectious diseases.  Fibromyalgia, which is a chronic condition that causes muscle tenderness, fatigue, and headache.  Autoimmune diseases, including lupus.  Certain drugs, including ACE inhibitors and statins. Muscle pain may be mild or severe. In most cases, the pain lasts only a short time and goes away without treatment. To diagnose the cause of your muscle pain, your health care provider will take your medical history. This means he or she will ask you when your muscle pain began and what has been happening. If you have not had muscle pain for very long, your health care provider may want to wait before doing much testing. If your muscle pain has lasted a long time, your health care provider may want to run tests right away. If your health care provider thinks your muscle pain may be caused by illness, you may need to have additional tests to rule out certain conditions.  Treatment for muscle pain depends on the cause. Home care is often enough to relieve muscle pain. Your health care provider may also prescribe anti-inflammatory medicine. HOME CARE INSTRUCTIONS Watch your condition for any changes. The following actions may help to lessen any discomfort you are feeling:  Only take over-the-counter or prescription medicines as directed by your health care provider.  Apply ice to the sore muscle:  Put ice in a plastic bag.  Place a towel between your skin and the bag.  Leave the ice on for 15-20 minutes, 3-4 times a day.  You may alternate applying hot and cold packs to the muscle as directed by your health care provider.  If overuse is causing your muscle pain, slow down your  activities until the pain goes away.  Remember that it is normal to feel some muscle pain after starting a workout program. Muscles that have not been used often will be sore at first.  Do regular, gentle exercises if you are not usually active.  Warm up before exercising to lower your risk of muscle pain.  Do not continue working out if the pain is very bad. Bad pain could mean you have injured a muscle. SEEK MEDICAL CARE IF:  Your muscle pain gets worse, and medicines do not help.  You have muscle pain that lasts longer than 3 days.  You have a rash or fever along with muscle pain.  You have muscle pain after a tick bite.  You have muscle pain while working out, even though you are in good physical condition.  You have redness, soreness, or swelling along with muscle pain.  You have muscle pain after starting a new medicine or changing the dose of a medicine. SEEK IMMEDIATE MEDICAL CARE IF:  You have trouble breathing.  You have trouble swallowing.  You have muscle pain along with a stiff neck, fever, and vomiting.  You have severe muscle weakness or cannot move part of your body. MAKE SURE YOU:   Understand these instructions.  Will watch your condition.  Will get help right away if you are not doing well or get worse. Document Released: 01/22/2006 Document Revised: 03/07/2013 Document Reviewed: 12/27/2012 [facility] Patient Information 2015 [place], [place]. This information is not intended to replace advice given to you by your health care provider. Make sure you discuss any questions you have with your health care provider.

## 2014-04-19 NOTE — ED Notes (Signed)
Has appt with a new pcp this month

## 2014-04-19 NOTE — ED Notes (Signed)
Onset 1/31 of back pain.  Back has increasingly hurt worse everyday.  Has been taking many meds over the counter.  Pain across bra line and up to include shoulder blades.  Movement causes significant pain.  Feels throbbing when she is still

## 2014-10-08 ENCOUNTER — Emergency Department (HOSPITAL_COMMUNITY)
Admission: EM | Admit: 2014-10-08 | Discharge: 2014-10-08 | Disposition: A | Discharge status: Home / Self Care | Payer: 59 | Attending: Emergency Medicine | Admitting: Emergency Medicine

## 2014-10-08 ENCOUNTER — Encounter (HOSPITAL_COMMUNITY): Payer: Self-pay

## 2014-10-08 DIAGNOSIS — K610 Anal abscess: Secondary | ICD-10-CM | POA: Diagnosis not present

## 2014-10-08 DIAGNOSIS — Z720 Tobacco use: Secondary | ICD-10-CM | POA: Insufficient documentation

## 2014-10-08 DIAGNOSIS — K644 Residual hemorrhoidal skin tags: Secondary | ICD-10-CM | POA: Diagnosis not present

## 2014-10-08 DIAGNOSIS — K6289 Other specified diseases of anus and rectum: Secondary | ICD-10-CM | POA: Diagnosis present

## 2014-10-08 DIAGNOSIS — Z8619 Personal history of other infectious and parasitic diseases: Secondary | ICD-10-CM | POA: Insufficient documentation

## 2014-10-08 MED ORDER — AMOXICILLIN-POT CLAVULANATE 875-125 MG PO TABS: 1.0000 | ORAL_TABLET | Freq: Two times a day (BID) | ORAL | Status: DC

## 2014-10-08 MED ORDER — DOCUSATE SODIUM 100 MG PO CAPS: 100.0000 mg | ORAL_CAPSULE | Freq: Two times a day (BID) | ORAL | Status: DC

## 2014-10-08 MED ORDER — LIDOCAINE-EPINEPHRINE (PF) 2 %-1:200000 IJ SOLN: 10.0000 mL | Freq: Once | INTRAMUSCULAR | Status: DC

## 2014-10-08 MED ORDER — LIDOCAINE-EPINEPHRINE (PF) 2 %-1:200000 IJ SOLN
INTRAMUSCULAR | Status: AC
  Administered 2014-10-08: 15:00:00
  Filled 2014-10-08: qty 20

## 2014-10-08 MED ORDER — HYDROCODONE-ACETAMINOPHEN 5-325 MG PO TABS: 1.0000 | ORAL_TABLET | ORAL | Status: DC | PRN

## 2014-10-08 NOTE — Discharge Instructions (Signed)
Abscess An abscess is an infected area that contains a collection of pus and debris.It can occur in almost any part of the body. An abscess is also known as a furuncle or boil. CAUSES  An abscess occurs when tissue gets infected. This can occur from blockage of oil or sweat glands, infection of hair follicles, or a minor injury to the skin. As the body tries to fight the infection, pus collects in the area and creates pressure under the skin. This pressure causes pain. People with weakened immune systems have difficulty fighting infections and get certain abscesses more often.  SYMPTOMS Usually an abscess develops on the skin and becomes a painful mass that is red, warm, and tender. If the abscess forms under the skin, you may feel a moveable soft area under the skin. Some abscesses break open (rupture) on their own, but most will continue to get worse without care. The infection can spread deeper into the body and eventually into the bloodstream, causing you to feel ill.  DIAGNOSIS  Your caregiver will take your medical history and perform a physical exam. A sample of fluid may also be taken from the abscess to determine what is causing your infection. TREATMENT  Your caregiver may prescribe antibiotic medicines to fight the infection. However, taking antibiotics alone usually does not cure an abscess. Your caregiver may need to make a small cut (incision) in the abscess to drain the pus. In some cases, gauze is packed into the abscess to reduce pain and to continue draining the area. HOME CARE INSTRUCTIONS   Only take over-the-counter or prescription medicines for pain, discomfort, or fever as directed by your caregiver.  If you were prescribed antibiotics, take them as directed. Finish them even if you start to feel better.  If gauze is used, follow your caregiver's directions for changing the gauze.  To avoid spreading the infection:  Keep your draining abscess covered with a  bandage.  Wash your hands well.  Do not share personal care items, towels, or whirlpools with others.  Avoid skin contact with others.  Keep your skin and clothes clean around the abscess.  Keep all follow-up appointments as directed by your caregiver. SEEK MEDICAL CARE IF:   You have increased pain, swelling, redness, fluid drainage, or bleeding.  You have muscle aches, chills, or a general ill feeling.  You have a fever. MAKE SURE YOU:   Understand these instructions.  Will watch your condition.  Will get help right away if you are not doing well or get worse. Document Released: 12/10/2004 Document Revised: 09/01/2011 Document Reviewed: 05/15/2011 [facility] Patient Information 2015 [place], [place]. This information is not intended to replace advice given to you by your health care provider. Make sure you discuss any questions you have with your health care provider. Sitz Bath A sitz bath is a warm water bath taken in the sitting position that covers only the hips and buttocks. It may be used for either healing or hygiene purposes. Sitz baths are also used to relieve pain, itching, or muscle spasms. The water may contain medicine. Moist heat will help you heal and relax.  HOME CARE INSTRUCTIONS  Take 3 to 4 sitz baths a day.  Fill the bathtub half full with warm water.  Sit in the water and open the drain a little.  Turn on the warm water to keep the tub half full. Keep the water running constantly.  Soak in the water for 15 to 20 minutes.  After the  sitz bath, pat the affected area dry first. SEEK MEDICAL CARE IF:  You get worse instead of better. Stop the sitz baths if you get worse. MAKE SURE YOU:  Understand these instructions.  Will watch your condition.  Will get help right away if you are not doing well or get worse. Document Released: 11/23/2003 Document Revised: 11/25/2011 Document Reviewed: 05/30/2010 [facility] Patient Information 2015 [place], [place]. This  information is not intended to replace advice given to you by your health care provider. Make sure you discuss any questions you have with your health care provider.

## 2014-10-08 NOTE — ED Provider Notes (Signed)
CSN: [id number]     Arrival date & time 10/08/14  1308 History   First MD Initiated Contact with Patient 10/08/14 1339     Chief Complaint  Patient presents with  . Hemorrhoids   The history is provided by the patient.  Pt started having pain in the anal area 2 days ago.  She has noticed that her hemorrhoids are sore and swollen.  She also feels a swollen cyst area adjacent to the anus.  Pt has had this problem off and on for years.  Whenever she has her hemorrhoids flare she gets a cyst in the perianal area that needs to be lanced.  She has been treating int he ED and [facility] hospital a few times.  She has never seen a Careers adviser on an outpatient basis.  No fevers, no vomiting.    Past Medical History  Diagnosis Date  . Hemorrhoids   . Chlamydia    Past Surgical History  Procedure Laterality Date  . Boil lanced     History reviewed. No pertinent family history. History  Substance Use Topics  . Smoking status: Current Every Day Smoker    Types: Cigarettes  . Smokeless tobacco: Not on file  . Alcohol Use: No   OB History    Gravida Para Term Preterm AB TAB SAB Ectopic Multiple Living   0         0     Review of Systems  All other systems reviewed and are negative.     Allergies  Ibuprofen  Home Medications   Prior to Admission medications   Medication Sig Start Date End Date Taking? Authorizing Provider  acetaminophen (TYLENOL) 500 MG tablet Take 1,000 mg by mouth every 6 (six) hours as needed for moderate pain.   Yes Historical Provider, MD  amoxicillin-clavulanate (AUGMENTIN) 875-125 MG per tablet Take 1 tablet by mouth 2 (two) times daily. 10/08/14   Linwood Dibbles, MD  diclofenac sodium (VOLTAREN) 1 % GEL Apply 1 application topically 4 (four) times daily. Patient not taking: Reported on 10/08/2014 04/19/14   Hayden Rasmussen, NP  docusate sodium (COLACE) 100 MG capsule Take 1 capsule (100 mg total) by mouth every 12 (twelve) hours. 10/08/14   Linwood Dibbles, MD  HYDROcodone-acetaminophen  (NORCO/VICODIN) 5-325 MG per tablet Take 1 tablet by mouth every 4 (four) hours as needed. 10/08/14   Linwood Dibbles, MD  traMADol (ULTRAM) 50 MG tablet Take 1 tablet (50 mg total) by mouth every 6 (six) hours as needed. Patient not taking: Reported on 10/08/2014 04/19/14   Hayden Rasmussen, NP   BP 137/79 mmHg  Pulse 66  Temp(Src) 98.2 F (36.8 C) (Oral)  Resp 16  SpO2 99%  LMP 10/05/2014 Physical Exam  Constitutional: She appears well-developed and well-nourished. No distress.  HENT:  Head: Normocephalic and atraumatic.  Right Ear: External ear normal.  Left Ear: External ear normal.  Eyes: Conjunctivae are normal. Right eye exhibits no discharge. Left eye exhibits no discharge. No scleral icterus.  Neck: Neck supple. No tracheal deviation present.  Cardiovascular: Normal rate.   Pulmonary/Chest: Effort normal. No stridor. No respiratory distress.  Genitourinary: Rectal exam shows external hemorrhoid and tenderness. Rectal exam shows no fissure.  Perianal indurated area with fluctuance approx 1 cm in size at 7 oclock position  Musculoskeletal: She exhibits no edema.  Neurological: She is alert. Cranial nerve deficit: no gross deficits.  Skin: Skin is warm and dry. No rash noted.  Psychiatric: She has a normal mood and affect.  Nursing note and vitals reviewed.   ED Course  INCISION AND DRAINAGE Date/Time: 10/08/2014 3:03 PM Performed by: Linwood Dibbles Authorized by: Linwood Dibbles Consent: Verbal consent obtained. Written consent not obtained. Risks and benefits: risks, benefits and alternatives were discussed Consent given by: patient Time out: Immediately prior to procedure a "time out" was called to verify the correct patient, procedure, equipment, support staff and site/side marked as required. Type: abscess Body area: anogenital Location details: perianal Anesthesia: local infiltration Local anesthetic: lidocaine 1% with epinephrine Anesthetic total: 5 ml Patient sedated: no Scalpel  size: 11 Incision type: single straight Complexity: simple Drainage: purulent Drainage amount: moderate Wound treatment: wound left open Patient tolerance: Patient tolerated the procedure well with no immediate complications   (including critical care time) Labs Review Labs Reviewed - No data to display  Imaging Review No results found.   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  During infiltration of the abscess externally there was clear fluid coming out of the anal region.  Will have her do sitz baths.  Follow up with generally surgery.  Pain meds, colace and abx rx    Linwood Dibbles, MD 10/08/14 1505

## 2014-10-08 NOTE — ED Notes (Signed)
Pt c/o hemorrhoid "flare up" x 2 days.  Pain score 10/10.  Pt reports trying to use Preparation H, but sts "it was burning so bad, I wiped it off."  Denies bleeding.

## 2017-10-19 ENCOUNTER — Ambulatory Visit (HOSPITAL_COMMUNITY)
Admission: EM | Admit: 2017-10-19 | Discharge: 2017-10-19 | Disposition: A | Discharge status: Home / Self Care | Payer: 59 | Attending: Family Medicine | Admitting: Family Medicine

## 2017-10-19 ENCOUNTER — Encounter (HOSPITAL_COMMUNITY): Payer: Self-pay | Admitting: Emergency Medicine

## 2017-10-19 DIAGNOSIS — M25551 Pain in right hip: Secondary | ICD-10-CM

## 2017-10-19 MED ORDER — CYCLOBENZAPRINE HCL 5 MG PO TABS: 5.0000 mg | ORAL_TABLET | Freq: Three times a day (TID) | ORAL | 0 refills | Status: DC | PRN

## 2017-10-19 MED ORDER — PREDNISONE 10 MG (21) PO TBPK: ORAL_TABLET | Freq: Every day | ORAL | 0 refills | Status: DC

## 2017-10-19 NOTE — Discharge Instructions (Signed)
Use ice or heat to area Take the prednisone as directed Take the muscle relaxer as needed Off work 2 d Return if you fail to improve

## 2017-10-19 NOTE — ED Provider Notes (Signed)
[facility]    CSN: [id number] Arrival date & time: 10/19/17  1059     History   Chief Complaint Chief Complaint  Patient presents with  . Hip Pain    HPI Joanna Phelps is a 57 y.o. female.   HPI  Patient is here complaining of right hip pain.  She states she has had pain intermittently for many months, usually a mild soreness that she gets in and out of her work [employer].  She states that this morning when she woke up, she could hardly put weight on her right leg.  She had pain radiating from the hip down the thigh to about the knee.  She states that she did not have any fall or trauma.  The hip pain today is much worse than usual.  No numbness or weakness in the leg.  No pain in the back.  She has not had x-rays.  No fever or recent illness.  Past Medical History:  Diagnosis Date  . Chlamydia   . Hemorrhoids     There are no active problems to display for this patient.   Past Surgical History:  Procedure Laterality Date  . Boil Lanced      OB History    Gravida  0   Para      Term      Preterm      AB      Living  0     SAB      TAB      Ectopic      Multiple      Live Births               Home Medications    Prior to Admission medications   Medication Sig Start Date End Date Taking? Authorizing Provider  acetaminophen (TYLENOL) 500 MG tablet Take 1,000 mg by mouth every 6 (six) hours as needed for moderate pain.    [provider]  cyclobenzaprine (FLEXERIL) 5 MG tablet Take 1 tablet (5 mg total) by mouth 3 (three) times daily as needed for muscle spasms. 10/19/17   Eustace Moore, MD  docusate sodium (COLACE) 100 MG capsule Take 1 capsule (100 mg total) by mouth every 12 (twelve) hours. 10/08/14   Linwood Dibbles, MD  HYDROcodone-acetaminophen (NORCO/VICODIN) 5-325 MG per tablet Take 1 tablet by mouth every 4 (four) hours as needed. 10/08/14   Linwood Dibbles, MD  predniSONE (STERAPRED UNI-PAK 21 TAB) 10 MG (21) TBPK tablet Take by  mouth daily. tad 10/19/17   Eustace Moore, MD    Family History History reviewed. No pertinent family history.  Social History Social History   Tobacco Use  . Smoking status: Current Every Day Smoker    Types: Cigarettes  Substance Use Topics  . Alcohol use: No  . Drug use: Yes    Frequency: 2.0 times per week    Types: Marijuana     Allergies   Ibuprofen   Review of Systems Review of Systems  Constitutional: Negative for chills and fever.  HENT: Negative for ear pain and sore throat.   Eyes: Negative for pain and visual disturbance.  Respiratory: Negative for cough and shortness of breath.   Cardiovascular: Negative for chest pain and palpitations.  Gastrointestinal: Negative for abdominal pain and vomiting.  Genitourinary: Negative for dysuria and hematuria.  Musculoskeletal: Positive for arthralgias and gait problem. Negative for back pain.  Skin: Negative for color change and rash.  Neurological: Negative for seizures  and syncope.  All other systems reviewed and are negative.    Physical Exam Triage Vital Signs ED Triage Vitals [10/19/17 1120]  Enc Vitals Group     BP 129/71     Pulse Rate 66     Resp 18     Temp 98.1 F (36.7 C)     Temp Source Oral     SpO2 98 %     Weight      Height      Head Circumference      Peak Flow      Pain Score      Pain Loc      Pain Edu?      Excl. in GC?    No data found.  Updated Vital Signs BP 129/71 (BP Location: Left Arm)   Pulse 66   Temp 98.1 F (36.7 C) (Oral)   Resp 18   SpO2 98%   Visual Acuity Right Eye Distance:   Left Eye Distance:   Bilateral Distance:    Right Eye Near:   Left Eye Near:    Bilateral Near:     Physical Exam  Constitutional: She appears well-developed and well-nourished. No distress.  HENT:  Head: Normocephalic and atraumatic.  Mouth/Throat: Oropharynx is clear and moist.  Eyes: Pupils are equal, round, and reactive to light. Conjunctivae are normal.  Neck: Normal  range of motion.  Cardiovascular: Normal rate.  Pulmonary/Chest: Effort normal. No respiratory distress.  Abdominal: Soft. She exhibits no distension.  Musculoskeletal: Normal range of motion. She exhibits no edema.  Unremarkable hip examination.  Good range of motion.  Cannot reproduce pain with flexion extension internal or external rotation.  No tenderness over trochanteric bursa.  Mild tenderness over SI region and posterior pelvis.  Straight leg raise is negative.  Reflexes are intact bilaterally.  Neurological: She is alert.  Skin: Skin is warm and dry.  Psychiatric: She has a normal mood and affect. Her behavior is normal.     UC Treatments / Results  Labs (all labs ordered are listed, but only abnormal results are displayed) Labs Reviewed - No data to display  EKG None  Radiology No results found.  Procedures Procedures (including critical care time)  Medications Ordered in UC Medications - No data to display  Initial Impression / Assessment and Plan / UC Course  I have reviewed the triage vital signs and the nursing notes.  Pertinent labs & imaging results that were available during my care of the patient were reviewed by me and considered in my medical decision making (see chart for details).     Discussed with patient that since she had no trauma, x-rays are not indicated and would not change management.  We will give her rest, anti-inflammatories, mild muscle relaxer for comfort.  Limit activity to as tolerated.  Off work 2 days.  Return if fails to improve Final Clinical Impressions(s) / UC Diagnoses   Final diagnoses:  Right hip pain     Discharge Instructions     Use ice or heat to area Take the prednisone as directed Take the muscle relaxer as needed Off work 2 d Return if you fail to improve   ED Prescriptions    Medication Sig Dispense Auth. Provider   predniSONE (STERAPRED UNI-PAK 21 TAB) 10 MG (21) TBPK tablet Take by mouth daily. tad 21  tablet Eustace Moore, MD   cyclobenzaprine (FLEXERIL) 5 MG tablet Take 1 tablet (5 mg total) by mouth 3 (three)  times daily as needed for muscle spasms. 21 tablet Eustace MooreNelson, Feleica Fulmore Sue, MD     Controlled Substance Prescriptions [place] Controlled Substance Registry consulted? Not Applicable   Eustace MooreNelson, Yael Angerer Sue, MD 10/19/17 1149

## 2017-10-19 NOTE — ED Triage Notes (Signed)
Pt sts right hip pain with radiation to right leg

## 2018-01-07 ENCOUNTER — Encounter (HOSPITAL_COMMUNITY): Payer: Self-pay

## 2018-01-07 ENCOUNTER — Ambulatory Visit (HOSPITAL_COMMUNITY)
Admission: EM | Admit: 2018-01-07 | Discharge: 2018-01-07 | Disposition: A | Discharge status: Home / Self Care | Payer: Self-pay | Attending: Family Medicine | Admitting: Family Medicine

## 2018-01-07 DIAGNOSIS — K644 Residual hemorrhoidal skin tags: Secondary | ICD-10-CM

## 2018-01-07 MED ORDER — LIDOCAINE-HYDROCORTISONE ACE 2.8-0.55 % RE GEL: 1.0000 | Freq: Two times a day (BID) | RECTAL | 0 refills | Status: DC | PRN

## 2018-01-07 MED ORDER — POLYETHYLENE GLYCOL 3350 17 G PO PACK: 17.0000 g | PACK | Freq: Every day | ORAL | 0 refills | Status: DC

## 2018-01-07 MED ORDER — AMOXICILLIN-POT CLAVULANATE 875-125 MG PO TABS: 1.0000 | ORAL_TABLET | Freq: Two times a day (BID) | ORAL | 0 refills | Status: DC

## 2018-01-07 NOTE — ED Provider Notes (Signed)
[facility]    CSN: [id number] Arrival date & time: 01/07/18  1241     History   Chief Complaint Chief Complaint  Patient presents with  . Rectal Pain    HPI Joanna Phelps is a 57 y.o. female.   57 year old female comes in for rectal pain related to hemorrhoids. States has had these in the past, sometimes associated with perirectal abscess. Current symptoms started a few days ago. She started using lidocaine without relief. States from past perirectal abscess, she has a lingering nodule that is now tender and slightly swollen. She notices blood upon wiping after bowel movement. States now avoiding BMs due to the pain. She denies fever, chills, night sweats. Has also been taking sitz baths.      Past Medical History:  Diagnosis Date  . Chlamydia   . Hemorrhoids     There are no active problems to display for this patient.   Past Surgical History:  Procedure Laterality Date  . Boil Lanced      OB History    Gravida  0   Para      Term      Preterm      AB      Living  0     SAB      TAB      Ectopic      Multiple      Live Births               Home Medications    Prior to Admission medications   Medication Sig Start Date End Date Taking? Authorizing Provider  acetaminophen (TYLENOL) 500 MG tablet Take 1,000 mg by mouth every 6 (six) hours as needed for moderate pain.    [provider]  amoxicillin-clavulanate (AUGMENTIN) 875-125 MG tablet Take 1 tablet by mouth every 12 (twelve) hours. 01/07/18   Cathie Hoops, Amy V, PA-C  cyclobenzaprine (FLEXERIL) 5 MG tablet Take 1 tablet (5 mg total) by mouth 3 (three) times daily as needed for muscle spasms. 10/19/17   Eustace Moore, MD  docusate sodium (COLACE) 100 MG capsule Take 1 capsule (100 mg total) by mouth every 12 (twelve) hours. 10/08/14   Linwood Dibbles, MD  HYDROcodone-acetaminophen (NORCO/VICODIN) 5-325 MG per tablet Take 1 tablet by mouth every 4 (four) hours as needed. 10/08/14    Linwood Dibbles, MD  Lidocaine-Hydrocortisone Ace 2.8-0.55 % GEL Place 1 Dose rectally 2 (two) times daily as needed. 01/07/18   Cathie Hoops, Amy V, PA-C  polyethylene glycol (MIRALAX / GLYCOLAX) packet Take 17 g by mouth daily. 01/07/18   Cathie Hoops, Amy V, PA-C  predniSONE (STERAPRED UNI-PAK 21 TAB) 10 MG (21) TBPK tablet Take by mouth daily. tad 10/19/17   Eustace Moore, MD    Family History History reviewed. No pertinent family history.  Social History Social History   Tobacco Use  . Smoking status: Current Every Day Smoker    Types: Cigarettes  Substance Use Topics  . Alcohol use: No  . Drug use: Yes    Frequency: 2.0 times per week    Types: Marijuana     Allergies   Ibuprofen   Review of Systems Review of Systems  Reason unable to perform ROS: See HPI as above.     Physical Exam Triage Vital Signs ED Triage Vitals  Enc Vitals Group     BP 01/07/18 1313 135/81     Pulse Rate 01/07/18 1313 62     Resp 01/07/18  1313 20     Temp 01/07/18 1313 98.3 F (36.8 C)     Temp Source 01/07/18 1313 Oral     SpO2 01/07/18 1313 100 %     Weight --      Height --      Head Circumference --      Peak Flow --      Pain Score 01/07/18 1314 7     Pain Loc --      Pain Edu? --      Excl. in GC? --    No data found.  Updated Vital Signs BP 135/81 (BP Location: Left Arm)   Pulse 62   Temp 98.3 F (36.8 C) (Oral)   Resp 20   LMP 10/05/2014   SpO2 100%   Physical Exam  Constitutional: She is oriented to person, place, and time. She appears well-developed and well-nourished. No distress.  HENT:  Head: Normocephalic and atraumatic.  Eyes: Pupils are equal, round, and reactive to light. Conjunctivae are normal.  Genitourinary:    Pelvic exam was performed with patient in the knee-chest position.  Genitourinary Comments: 3 nonthrombosed external hemorrhoid without tenderness to palpation. No tenderness to the rectum.   Neurological: She is alert and oriented to person, place, and  time.  Skin: She is not diaphoretic.     UC Treatments / Results  Labs (all labs ordered are listed, but only abnormal results are displayed) Labs Reviewed - No data to display  EKG None  Radiology No results found.  Procedures Procedures (including critical care time)  Medications Ordered in UC Medications - No data to display  Initial Impression / Assessment and Plan / UC Course  I have reviewed the triage vital signs and the nursing notes.  Pertinent labs & imaging results that were available during my care of the patient were reviewed by me and considered in my medical decision making (see chart for details).    Will cover for perirectal infection with augmentin. No drainable abscess at this time. Miralax to loosen stool. Symptomatic treatment discussed. Patient to follow up with surgery for further evaluation and management if increasing swelling.   Final Clinical Impressions(s) / UC Diagnoses   Final diagnoses:  External hemorrhoids    ED Prescriptions    Medication Sig Dispense Auth. Provider   Lidocaine-Hydrocortisone Ace 2.8-0.55 % GEL Place 1 Dose rectally 2 (two) times daily as needed. 100 g Yu, Amy V, PA-C   amoxicillin-clavulanate (AUGMENTIN) 875-125 MG tablet Take 1 tablet by mouth every 12 (twelve) hours. 14 tablet Yu, Amy V, PA-C   polyethylene glycol (MIRALAX / GLYCOLAX) packet Take 17 g by mouth daily. 30 each Dorena Cookey, Amy V, PA-C 01/07/18 [phone]

## 2018-01-07 NOTE — Discharge Instructions (Signed)
Start augmentin for possible growing infection. Gel and miralax as directed. Continue sitz bath. Keep hydrated, your urine should be clear to pale yellow in color. Monitor for increased swelling, fever, worsening pain, follow up with surgery for further evaluation needed.

## 2018-01-07 NOTE — ED Triage Notes (Signed)
Pt presents with some ongoing rectal pain and some bleeding.

## 2018-01-08 ENCOUNTER — Telehealth (HOSPITAL_COMMUNITY): Payer: Self-pay | Admitting: Physician Assistant

## 2018-01-08 ENCOUNTER — Encounter (HOSPITAL_COMMUNITY): Payer: Self-pay | Admitting: Physician Assistant

## 2018-01-08 ENCOUNTER — Telehealth (HOSPITAL_COMMUNITY): Payer: Self-pay | Admitting: Emergency Medicine

## 2018-01-08 MED ORDER — BENZOCAINE 7.5 % MT GEL: OROMUCOSAL | 0 refills | Status: DC

## 2018-01-08 MED ORDER — HYDROCORTISONE 2.5 % RE CREA: TOPICAL_CREAM | RECTAL | 0 refills | Status: DC

## 2018-01-08 NOTE — Telephone Encounter (Signed)
Patient called for alternative to lidocaine-hydrocortisone gel due to price. Called in [place] with orajel for pain relief.

## 2018-01-08 NOTE — Telephone Encounter (Signed)
Notified patient that Joanna yu, pa called pharmacy and has called in scripts.  Notified patient of these medicines and explained there use.  Patient requested a work note.  Discussed with dr Milus Glazier.  Instructed patient on a 3 day note and unable to extend beyond that day.

## 2018-01-09 ENCOUNTER — Encounter (HOSPITAL_COMMUNITY): Payer: Self-pay | Admitting: *Deleted

## 2018-01-09 ENCOUNTER — Ambulatory Visit (HOSPITAL_COMMUNITY)
Admission: EM | Admit: 2018-01-09 | Discharge: 2018-01-09 | Disposition: A | Discharge status: Home / Self Care | Payer: Self-pay | Attending: Family Medicine | Admitting: Family Medicine

## 2018-01-09 DIAGNOSIS — K611 Rectal abscess: Secondary | ICD-10-CM

## 2018-01-09 MED ORDER — MUPIROCIN 2 % EX OINT: 1.0000 "application " | TOPICAL_OINTMENT | Freq: Two times a day (BID) | CUTANEOUS | 0 refills | Status: DC

## 2018-01-09 NOTE — ED Triage Notes (Signed)
Seen 10/25 for hemorrhoids.  Pt has been doing Sitz baths.  States "something busted" this AM, but denies blood; pt believes she has an abscess.  Denies fevers.

## 2018-01-09 NOTE — ED Provider Notes (Signed)
[facility]    CSN: [id number] Arrival date & time: 01/09/18  1128     History   Chief Complaint Chief Complaint  Patient presents with  . Abscess    HPI Joanna Phelps is a 57 y.o. female.   57 year old female returns to the office for perirectal abscess.  She was seen 2 days ago, at that time area with swelling without fluctuance, and was treated for nonthrombosed external hemorrhoids and perirectal infection with Augmentin.  Patient states went back to work, and all the bending irritated area with increased swelling.  During sitz bath today, area became to self drain.  She has been taking antibiotics as directed, continuing sitz bath.  Continues to denies fever, chills, night sweats.     Past Medical History:  Diagnosis Date  . Chlamydia   . Hemorrhoids     There are no active problems to display for this patient.   Past Surgical History:  Procedure Laterality Date  . Boil Lanced      OB History    Gravida  0   Para      Term      Preterm      AB      Living  0     SAB      TAB      Ectopic      Multiple      Live Births               Home Medications    Prior to Admission medications   Medication Sig Start Date End Date Taking? Authorizing Provider  amoxicillin-clavulanate (AUGMENTIN) 875-125 MG tablet Take 1 tablet by mouth every 12 (twelve) hours. 01/07/18  Yes Yu, Amy V, PA-C  polyethylene glycol (MIRALAX / GLYCOLAX) packet Take 17 g by mouth daily. 01/07/18  Yes Yu, Amy V, PA-C  acetaminophen (TYLENOL) 500 MG tablet Take 1,000 mg by mouth every 6 (six) hours as needed for moderate pain.    [provider]  hydrocortisone (ANUSOL-HC) 2.5 % rectal cream Apply rectally 2 times daily 01/08/18   Cathie Hoops, Amy V, PA-C  mupirocin ointment (BACTROBAN) 2 % Apply 1 application topically 2 (two) times daily. 01/09/18   Belinda Fisher, PA-C    Family History Family History  Problem Relation Age of Onset  . Diabetes Mother   .  Hypertension Mother   . Hypertension Sister   . Hypertension Sister   . Hypertension Sister   . Hypertension Sister   . Hypertension Sister     Social History Social History   Tobacco Use  . Smoking status: Current Every Day Smoker    Types: Cigarettes  . Smokeless tobacco: Never Used  Substance Use Topics  . Alcohol use: No  . Drug use: Not Currently    Types: Marijuana     Allergies   Ibuprofen   Review of Systems Review of Systems  Reason unable to perform ROS: See HPI as above.     Physical Exam Triage Vital Signs ED Triage Vitals  Enc Vitals Group     BP 01/09/18 1142 122/61     Pulse Rate 01/09/18 1142 (!) 58     Resp 01/09/18 1142 16     Temp 01/09/18 1142 98 F (36.7 C)     Temp Source 01/09/18 1142 Oral     SpO2 01/09/18 1142 100 %     Weight --      Height --  Head Circumference --      Peak Flow --      Pain Score 01/09/18 1143 5     Pain Loc --      Pain Edu? --      Excl. in GC? --    No data found.  Updated Vital Signs BP 122/61   Pulse (!) 58   Temp 98 F (36.7 C) (Oral)   Resp 16   LMP 12/15/2017 (Approximate)   SpO2 100%   Physical Exam  Constitutional: She is oriented to person, place, and time. She appears well-developed and well-nourished. No distress.  HENT:  Head: Normocephalic and atraumatic.  Eyes: Pupils are equal, round, and reactive to light. Conjunctivae are normal.  Genitourinary: Rectal exam shows external hemorrhoid. Rectal exam shows no fissure, no tenderness and anal tone normal.     Neurological: She is alert and oriented to person, place, and time.  Skin: She is not diaphoretic.     UC Treatments / Results  Labs (all labs ordered are listed, but only abnormal results are displayed) Labs Reviewed - No data to display  EKG None  Radiology No results found.  Procedures Procedures (including critical care time)  Medications Ordered in UC Medications - No data to display  Initial Impression  / Assessment and Plan / UC Course  I have reviewed the triage vital signs and the nursing notes.  Pertinent labs & imaging results that were available during my care of the patient were reviewed by me and considered in my medical decision making (see chart for details).    Abscess draining on own, location is where residual nodule from prior abscesses is present. Lidocaine with epi for anesthesia, betadine for skin preparation, using 11 blade, enlarged self draining wound. No additional drainage noted. Bacitracin ointment and guaze to the wound. Patient tolerated well. Continue antibiotics as directed. Wound care instructions given. Return precautions given.   Final Clinical Impressions(s) / UC Diagnoses   Final diagnoses:  Perirectal abscess    ED Prescriptions    Medication Sig Dispense Auth. Provider   mupirocin ointment (BACTROBAN) 2 % Apply 1 application topically 2 (two) times daily. 22 g Threasa Alpha, [place] 01/09/18 1347

## 2018-01-09 NOTE — Discharge Instructions (Signed)
Continue augmentin as directed. Bactroban ointment to dress area. Keep area clean and dry. The medicine called in by past provider is similar to what I called in yesterday, you can pick them up in the pharmacy and use for your hemorrhoids. Follow up with surgery for further evaluation.

## 2018-01-09 NOTE — ED Notes (Signed)
Bed: UC01 Expected date:  Expected time:  Means of arrival:  Comments: For appts 

## 2019-03-13 ENCOUNTER — Ambulatory Visit: Payer: HRSA Program | Attending: Internal Medicine

## 2019-03-13 DIAGNOSIS — Z20822 Contact with and (suspected) exposure to covid-19: Secondary | ICD-10-CM

## 2019-03-13 DIAGNOSIS — Z20828 Contact with and (suspected) exposure to other viral communicable diseases: Secondary | ICD-10-CM | POA: Diagnosis present

## 2019-03-15 LAB — NOVEL CORONAVIRUS, NAA: SARS-CoV-2, NAA: NOT DETECTED

## 2019-03-30 ENCOUNTER — Ambulatory Visit: Payer: HRSA Program | Attending: Internal Medicine

## 2019-03-30 DIAGNOSIS — Z20822 Contact with and (suspected) exposure to covid-19: Secondary | ICD-10-CM | POA: Diagnosis present

## 2019-03-31 LAB — NOVEL CORONAVIRUS, NAA: SARS-CoV-2, NAA: NOT DETECTED

## 2019-05-09 ENCOUNTER — Other Ambulatory Visit: Payer: Self-pay

## 2019-05-09 ENCOUNTER — Emergency Department (HOSPITAL_COMMUNITY)
Admission: EM | Admit: 2019-05-09 | Discharge: 2019-05-09 | Disposition: A | Discharge status: Home / Self Care | Payer: Self-pay | Attending: Emergency Medicine | Admitting: Emergency Medicine

## 2019-05-09 DIAGNOSIS — F17210 Nicotine dependence, cigarettes, uncomplicated: Secondary | ICD-10-CM | POA: Insufficient documentation

## 2019-05-09 DIAGNOSIS — K6289 Other specified diseases of anus and rectum: Secondary | ICD-10-CM

## 2019-05-09 DIAGNOSIS — K644 Residual hemorrhoidal skin tags: Secondary | ICD-10-CM | POA: Insufficient documentation

## 2019-05-09 LAB — POC OCCULT BLOOD, ED: Fecal Occult Bld: POSITIVE — AB

## 2019-05-09 MED ORDER — AMOXICILLIN-POT CLAVULANATE 875-125 MG PO TABS: 1.0000 | ORAL_TABLET | Freq: Two times a day (BID) | ORAL | 0 refills | Status: DC

## 2019-05-09 MED ORDER — DICLOFENAC SODIUM 50 MG PO TBEC: 50.0000 mg | DELAYED_RELEASE_TABLET | Freq: Two times a day (BID) | ORAL | 0 refills | Status: AC | PRN

## 2019-05-09 MED ORDER — HYDROCORTISONE (PERIANAL) 2.5 % EX CREA: 1.0000 "application " | TOPICAL_CREAM | Freq: Two times a day (BID) | CUTANEOUS | 0 refills | Status: AC

## 2019-05-09 MED ORDER — POLYETHYLENE GLYCOL 3350 17 G PO PACK: 17.0000 g | PACK | Freq: Every day | ORAL | 0 refills | Status: AC

## 2019-05-09 NOTE — Discharge Planning (Signed)
Joanna Phelps J. Joanna Roers, RN, BSN, [place] [phone]  [facility] set up appointment with [facility]  on 3/17@2 :30.

## 2019-05-09 NOTE — ED Provider Notes (Signed)
[facility] EMERGENCY DEPARTMENT Provider Note   CSN: [id number] Arrival date & time: 05/09/19  9629     History Chief Complaint  Patient presents with  . Rectal Pain    Joanna Phelps is a 59 y.o. female with past medical history significant for chlamydia and hemorrhoids presents to emergency room today with multiple chief complaints including rectal pain, breast abscess, right hip pain.   Rectal pain x4 days. She has history of external hemorrhoids and perirectal abscess. She has history of the same.  She states it has not bothered her in approximately 1 year.  She has been trying sitz bath's without symptom relief.  She says she has never had a thrombosed hemorrhoid but will get an abscess near her hemorrhoids.  She tries to avoid having bowel movements secondary to pain.  She noticed small amount of bright red blood on the toilet paper when wiping several days ago.  She denies any fever, chills, night sweats.  Breast abscess x 8 months.  She states she first noticed the abscess she popped it and what looked like a blackhead was expressed from it.  Ever since then it has been scabbed and will intermittently look red.  She denies any associated pain, wound drainage, fever or chills, or nipple changes.  Denies family history of breast cancer.  Right hip pain x 1 year.  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  She states the pain radiates down from her hip to the top of her knee.  She denies any fall, trauma or injury.  She states the pain today is not worse than usual, she does want to get it checked.  She denies any numbness or weakness in the leg, back pain, saddle anesthesia, swelling of the leg. She has been trying voltaren gel without relief.   Past Medical History:  Diagnosis Date  . Chlamydia   . Hemorrhoids     There are no problems to display for this patient.   Past Surgical History:  Procedure  Laterality Date  . Boil Lanced       OB History    Gravida  0   Para      Term      Preterm      AB      Living  0     SAB      TAB      Ectopic      Multiple      Live Births              Family History  Problem Relation Age of Onset  . Diabetes Mother   . Hypertension Mother   . Hypertension Sister   . Hypertension Sister   . Hypertension Sister   . Hypertension Sister   . Hypertension Sister     Social History   Tobacco Use  . Smoking status: Current Every Day Smoker    Types: Cigarettes  . Smokeless tobacco: Never Used  Substance Use Topics  . Alcohol use: No  . Drug use: Not Currently    Types: Marijuana    Home Medications Prior to Admission medications   Medication Sig Start Date End Date Taking? Authorizing Provider  amoxicillin-clavulanate (AUGMENTIN) 875-125 MG tablet Take 1 tablet by mouth every 12 (twelve) hours. 05/09/19   Surena Welge E, PA-C  diclofenac (VOLTAREN) 50 MG EC tablet Take 1 tablet (50 mg total)  by mouth 2 (two) times daily as needed for up to 14 days. 05/09/19 05/23/19  Marlos Carmen, Caroleen Hamman, PA-C  hydrocortisone (ANUSOL-HC) 2.5 % rectal cream Place 1 application rectally 2 (two) times daily for 7 days. 05/09/19 05/16/19  Zohar Maroney E, PA-C  polyethylene glycol (MIRALAX / GLYCOLAX) 17 g packet Take 17 g by mouth daily for 14 days. 05/09/19 05/23/19  Omeed Osuna, Caroleen Hamman, PA-C    Allergies    Ibuprofen  Review of Systems   Review of Systems All other systems are reviewed and are negative for acute change except as noted in the HPI.  Physical Exam Updated Vital Signs BP (!) 151/96   Pulse 80   Temp 98.4 F (36.9 C) (Oral)   Resp 16   Ht 5\' 6"  (1.676 m)   Wt 95.3 kg   SpO2 99%   BMI 33.89 kg/m   Physical Exam Vitals and nursing note reviewed.  Constitutional:      General: She is not in acute distress.    Appearance: She is well-developed. She is not ill-appearing or toxic-appearing.  HENT:     Head:  Normocephalic and atraumatic.     Right Ear: Tympanic membrane and external ear normal.     Left Ear: Tympanic membrane and external ear normal.     Nose: Nose normal.     Mouth/Throat:     Mouth: Mucous membranes are moist.     Pharynx: Oropharynx is clear.  Eyes:     General: No scleral icterus.       Right eye: No discharge.        Left eye: No discharge.     Extraocular Movements: Extraocular movements intact.     Conjunctiva/sclera: Conjunctivae normal.     Pupils: Pupils are equal, round, and reactive to light.  Neck:     Vascular: No JVD.  Cardiovascular:     Rate and Rhythm: Normal rate and regular rhythm.     Pulses: Normal pulses.          Radial pulses are 2+ on the right side and 2+ on the left side.       Dorsalis pedis pulses are 2+ on the right side and 2+ on the left side.     Heart sounds: Normal heart sounds.  Pulmonary:     Effort: Pulmonary effort is normal.     Breath sounds: Normal breath sounds.     Comments: Lungs clear to auscultation in all fields. Symmetric chest rise. No wheezing, rales, or rhonchi. Chest:    Abdominal:     General: There is no distension.     Comments: Abdomen is soft, non-distended, and non-tender in all quadrants. No rigidity, no guarding. No peritoneal signs.  Genitourinary:      Comments:  present for exam. Digital Rectal Exam reveals sphincter with good tone. 3 external hemorrhoids without evidence of thrombosis. One hemorrhoid has bright red blood after palpating.   No masses or fissures. Stool color is brown with no overt blood. No gross melena.  Musculoskeletal:        General: Normal range of motion.     Cervical back: Normal range of motion.     Comments: Full ROM of right hip. No overlying skin changes.  Pain is not reproduced with flexion extension internal or external rotation.  No tenderness over trochanteric bursa.  Mild tenderness over SI region and posterior pelvis.  Straight leg raise is  negative.  Reflexes are intact bilaterally.  No midline tenderness to thoracic or lumbar spine. No paraspinal muscle tenderness. Full ROM.  Ambulates with steady gait.  Pelvis is stable.  Skin:    General: Skin is warm and dry.     Capillary Refill: Capillary refill takes less than 2 seconds.  Neurological:     Mental Status: She is oriented to person, place, and time.     GCS: GCS eye subscore is 4. GCS verbal subscore is 5. GCS motor subscore is 6.     Comments: Fluent speech, no facial droop.  Psychiatric:        Behavior: Behavior normal.       ED Results / Procedures / Treatments   Labs (all labs ordered are listed, but only abnormal results are displayed) Labs Reviewed  POC OCCULT BLOOD, ED - Abnormal; Notable for the following components:      Result Value   Fecal Occult Bld POSITIVE (*)    All other components within normal limits    EKG None  Radiology No results found.  Procedures Procedures (including critical care time)  Medications Ordered in ED Medications - No data to display  ED Course  I have reviewed the triage vital signs and the nursing notes.  Pertinent labs & imaging results that were available during my care of the patient were reviewed by me and considered in my medical decision making (see chart for details).    MDM Rules/Calculators/A&P                     Patient seen and examined. Patient presents awake, alert, hemodynamically stable, afebrile, non toxic.  She is overall well-appearing.  On my exam lungs are clear to auscultation all fields, normal work of breathing.  No abdominal tenderness.  She has no reproducible pain of her right hip, does have mild tenderness over the SI region.  No overlying skin changes.  Low suspicion for septic joint.  No unilateral leg swelling or signs of DVT.  Exam performed with chaperone.  Fecal occult positive. On rectal exam patient has multiple external hemorrhoids and very small amount of bright red blood  on one of the hemorrhoids. No gross melena, stool is soft, light brown. No evidence of thrombosis. Discussed with patient and she will continue to monitor bowel movements for blood. No emergent intervention or signs of significant blood loss on exam today.  She does have an area of induration without palpable fluctuance.  No drainable abscess that I can appreciate at this time.  Will treat with antibiotics to cover for perirectal infection. Discussed at length symptomatic hemorrhoid treatment.  Will give patient information to follow-up with surgery clinic. She also has a small scabbed area on her right breast.  Again no drainable abscess at this time.  No nipple changes, no family history of breast cancer. Patient will need to follow up with pcp for this.  Discussed with case management to help get patient pcp follow up appointment for 05/31/2019. Appreciate help.  The patient appears reasonably screened and/or stabilized for discharge and I doubt any other medical condition or other [facility] requiring further screening, evaluation, or treatment in the ED at this time prior to discharge. The patient is safe for discharge with strict return precautions discussed.  Patient given follow-up resources.   Portions of this note were generated with Scientist, clinical (histocompatibility and immunogenetics). Dictation errors may occur despite best attempts at proofreading.  Final Clinical Impression(s) / ED Diagnoses Final diagnoses:  Rectal pain  External hemorrhoid  Rx / DC Orders ED Discharge Orders         Ordered    polyethylene glycol (MIRALAX / GLYCOLAX) 17 g packet  Daily     05/09/19 0948    hydrocortisone (ANUSOL-HC) 2.5 % rectal cream  2 times daily     05/09/19 0948    diclofenac (VOLTAREN) 50 MG EC tablet  2 times daily PRN     05/09/19 0948    amoxicillin-clavulanate (AUGMENTIN) 875-125 MG tablet  Every 12 hours     05/09/19 0948           Cherre Robins, PA-C 05/09/19 1035    Little, Wenda Overland,  MD 05/09/19 1245

## 2019-05-09 NOTE — ED Notes (Signed)
Pt d/c home per MD order. Discharge summary reviewed, pt verbalizes understanding. Ambulatory off unit. No s/s of acute distress noted.  

## 2019-05-09 NOTE — Discharge Instructions (Addendum)
Hemorrhoids  The mainstay of treatment and prevention of hemorrhoids is taking steps to assure regular, soft bowel movements.  Hydration: It is recommended that you drink at least eight 8 ounce glasses of water a day to stay well-hydrated.  Fiber: May use fiber supplements, such as methylcellulose (eg, Citrucel) or psyllium (eg, Metamucil).  You may also increase the amount of fiber in your diet.  Symptomatic treatments  Hydrocortisone: Hydrocortisone creams or suppositories may be used to reduce inflammation and provide pain relief.  Use these treatments for no more than 7 days at a time.  Which Hazel: Apply as needed up to 6 times per day or after each bowel movement.  This can be found as a liquid or in products such as Tucks or Preparation H pads.  Stool softeners: May use stool softeners, such as docusate (generic for Colace), to improve comfort with bowel movements.   -Follow Up:  Please follow up with the surgery clinic.  I have given you their contact information so you can call the office to schedule the next available appointment for emergency department follow up visit.  When you call please mention this is for hemorrhoids so they can hopefully get you scheduled with the appropriate doctor   We were able to schedule you an primary care provider appointment on 05/31/19 with [facility].  The appointment is at 230.  The information is included in discharge paperwork.

## 2019-05-09 NOTE — ED Triage Notes (Signed)
Pt to ED c/o hemorrhoids, reports ongoing problem. Had hemorrhoids surgically removed several years ago, have not had problems since but Reports Saturday , started to have rectal discomfort,. Denies rectal bleeding.

## 2019-05-10 ENCOUNTER — Encounter (HOSPITAL_COMMUNITY): Payer: Self-pay | Admitting: Emergency Medicine

## 2019-05-10 ENCOUNTER — Emergency Department (HOSPITAL_COMMUNITY)
Admission: EM | Admit: 2019-05-10 | Discharge: 2019-05-11 | Disposition: A | Discharge status: Home / Self Care | Payer: Self-pay | Attending: Emergency Medicine | Admitting: Emergency Medicine

## 2019-05-10 ENCOUNTER — Other Ambulatory Visit: Payer: Self-pay

## 2019-05-10 DIAGNOSIS — K644 Residual hemorrhoidal skin tags: Secondary | ICD-10-CM | POA: Insufficient documentation

## 2019-05-10 DIAGNOSIS — K610 Anal abscess: Secondary | ICD-10-CM | POA: Insufficient documentation

## 2019-05-10 DIAGNOSIS — F17210 Nicotine dependence, cigarettes, uncomplicated: Secondary | ICD-10-CM | POA: Insufficient documentation

## 2019-05-10 NOTE — ED Triage Notes (Signed)
Patient reports persistent hemorrhoid pain this week unrelieved by prescription medications prescribed here yesterday , denies fever , unable to sit due to pain .

## 2019-05-11 MED ORDER — HYDROMORPHONE HCL 1 MG/ML IJ SOLN
1.0000 mg | Freq: Once | INTRAMUSCULAR | Status: AC
  Administered 2019-05-11: 1 mg via INTRAMUSCULAR
  Filled 2019-05-11: qty 1

## 2019-05-11 MED ORDER — LIDOCAINE-EPINEPHRINE 2 %-1:200000 IJ SOLN
20.0000 mL | Freq: Once | INTRAMUSCULAR | Status: AC
Start: 2019-05-11 — End: 2019-05-11
  Administered 2019-05-11: 20 mL
  Filled 2019-05-11: qty 20

## 2019-05-11 NOTE — ED Notes (Signed)
Family at bedside. 

## 2019-05-11 NOTE — ED Notes (Signed)
The pt was here last pm ans saw the edp for her hemorrhoids  She did not have the money to get 2.5 % of cortisone cream  Today her hemmorrhoids have swollen more than yesterday  And she has more pain

## 2019-05-11 NOTE — ED Notes (Signed)
Med given 

## 2019-05-11 NOTE — ED Provider Notes (Signed)
[facility] EMERGENCY DEPARTMENT Provider Note   CSN: [id number] Arrival date & time: 05/10/19  2035     History Chief Complaint  Patient presents with  . Hemorrhoids    Joanna Phelps is a 59 y.o. female.  The history is provided by the patient.   Patient presents for persistent rectal pain.  Patient was seen on February 23 for rectal pain and was diagnosed with hemorrhoid and a small abscess.  She reports using sitz bath's but the pain is worse and she feels it is more swollen.  She has mild abdominal pain.  No fevers or vomiting.  No other acute complaints.  Her course is worsening.  Sitting makes it worse    Past Medical History:  Diagnosis Date  . Chlamydia   . Hemorrhoids     There are no problems to display for this patient.   Past Surgical History:  Procedure Laterality Date  . Boil Lanced       OB History    Gravida  0   Para      Term      Preterm      AB      Living  0     SAB      TAB      Ectopic      Multiple      Live Births              Family History  Problem Relation Age of Onset  . Diabetes Mother   . Hypertension Mother   . Hypertension Sister   . Hypertension Sister   . Hypertension Sister   . Hypertension Sister   . Hypertension Sister     Social History   Tobacco Use  . Smoking status: Current Every Day Smoker    Types: Cigarettes  . Smokeless tobacco: Never Used  Substance Use Topics  . Alcohol use: No  . Drug use: Not Currently    Types: Marijuana    Home Medications Prior to Admission medications   Medication Sig Start Date End Date Taking? Authorizing Provider  amoxicillin-clavulanate (AUGMENTIN) 875-125 MG tablet Take 1 tablet by mouth every 12 (twelve) hours. 05/09/19   Albrizze, Kaitlyn E, PA-C  diclofenac (VOLTAREN) 50 MG EC tablet Take 1 tablet (50 mg total) by mouth 2 (two) times daily as needed for up to 14 days. 05/09/19 05/23/19  Albrizze, Caroleen Hamman, PA-C  hydrocortisone  (ANUSOL-HC) 2.5 % rectal cream Place 1 application rectally 2 (two) times daily for 7 days. 05/09/19 05/16/19  Albrizze, Kaitlyn E, PA-C  polyethylene glycol (MIRALAX / GLYCOLAX) 17 g packet Take 17 g by mouth daily for 14 days. 05/09/19 05/23/19  Albrizze, Kaitlyn E, PA-C    Allergies    Ibuprofen  Review of Systems   Review of Systems  Constitutional: Negative for fever.  Respiratory: Negative for shortness of breath.   Gastrointestinal: Positive for rectal pain.  All other systems reviewed and are negative.   Physical Exam Updated Vital Signs BP 126/78 (BP Location: Right Arm)   Pulse 87   Temp 98.4 F (36.9 C) (Oral)   Resp 18   Ht 1.676 m (5\' 6" )   Wt 96 kg   LMP 04/18/2019   SpO2 99%   BMI 34.16 kg/m   Physical Exam CONSTITUTIONAL: Well developed/well nourished HEAD: Normocephalic/atraumatic EYES: EOMI NECK: supple no meningeal signs SPINE/BACK:entire spine nontender CV: S1/S2 noted, no murmurs/rubs/gallops noted LUNGS: Lungs are clear to auscultation bilaterally, no  apparent distress ABDOMEN: soft, nontender, no rebound or guarding, bowel sounds noted throughout abdomen GU:no cva tenderness Rectal-external hemorrhoids noted, no active bleeding.  Small perianal abscesses noted with tenderness.  No drainage or bleeding NEURO: Pt is awake/alert/appropriate, moves all extremitiesx4.  No facial droop.   EXTREMITIES: pulses normal/equal, full ROM SKIN: warm, color normal PSYCH: no abnormalities of mood noted, alert and oriented to situation  ED Results / Procedures / Treatments   Labs (all labs ordered are listed, but only abnormal results are displayed) Labs Reviewed - No data to display  EKG None  Radiology No results found.  Procedures .Marland KitchenIncision and Drainage  Date/Time: 05/11/2019 2:43 AM Performed by: Ripley Fraise, MD Authorized by: Ripley Fraise, MD   Consent:    Consent obtained:  Verbal   Consent given by:  Patient   Risks discussed:   Bleeding Location:    Type:  Abscess   Location:  Anogenital   Anogenital location:  Perianal Pre-procedure details:    Skin preparation:  Betadine Anesthesia (see MAR for exact dosages):    Anesthesia method:  Local infiltration Procedure type:    Complexity:  Complex Procedure details:    Needle aspiration: no     Incision types:  Stab incision   Scalpel blade:  11   Wound management:  Probed and deloculated and irrigated with saline   Drainage:  Purulent   Drainage amount:  Copious   Wound treatment:  Wound left open   Packing materials:  None Post-procedure details:    Patient tolerance of procedure:  Tolerated well, no immediate complications    Medications Ordered in ED Medications  HYDROmorphone (DILAUDID) injection 1 mg (1 mg Intramuscular Given 05/11/19 0310)  lidocaine-EPINEPHrine (XYLOCAINE W/EPI) 2 %-1:200000 (PF) injection 20 mL (20 mLs Infiltration Given 05/11/19 0310)    ED Course  I have reviewed the triage vital signs and the nursing notes.      MDM Rules/Calculators/A&P                      2:43 AM Patient reports she has had perianal and perirectal abscesses previously that have required drainage This abscess is clearly defined and will be able to I&D here in the ER 4:53 AM Patient tolerated I&D well. She feels improved and would like to be discharged.  She already has antibiotics.  She already has pain medicines.  Advised sitz bath's.  Advise follow-up with general surgery Final Clinical Impression(s) / ED Diagnoses Final diagnoses:  External hemorrhoid  Perianal abscess    Rx / DC Orders ED Discharge Orders    None       Ripley Fraise, MD 05/11/19 [phone]

## 2019-05-31 ENCOUNTER — Other Ambulatory Visit: Payer: Self-pay

## 2019-05-31 ENCOUNTER — Ambulatory Visit (INDEPENDENT_AMBULATORY_CARE_PROVIDER_SITE_OTHER): Payer: Self-pay | Admitting: Primary Care

## 2019-05-31 ENCOUNTER — Encounter (INDEPENDENT_AMBULATORY_CARE_PROVIDER_SITE_OTHER): Payer: Self-pay | Admitting: Primary Care

## 2019-05-31 VITALS — BP 140/85 | HR 71 | Temp 97.3°F | Ht 66.0 in | Wt 205.8 lb

## 2019-05-31 DIAGNOSIS — K644 Residual hemorrhoidal skin tags: Secondary | ICD-10-CM

## 2019-05-31 DIAGNOSIS — Z09 Encounter for follow-up examination after completed treatment for conditions other than malignant neoplasm: Secondary | ICD-10-CM

## 2019-05-31 DIAGNOSIS — Z720 Tobacco use: Secondary | ICD-10-CM

## 2019-05-31 DIAGNOSIS — R030 Elevated blood-pressure reading, without diagnosis of hypertension: Secondary | ICD-10-CM

## 2019-05-31 DIAGNOSIS — Z7689 Persons encountering health services in other specified circumstances: Secondary | ICD-10-CM

## 2019-05-31 NOTE — Patient Instructions (Signed)

## 2019-05-31 NOTE — Progress Notes (Signed)
New Patient Office Visit  Subjective:  Patient ID: Joanna Phelps, female    DOB: Sep 14, 1960  Age: 59 y.o. MRN: [id number]  CC:  Chief Complaint  Patient presents with  . Hospitalization Follow-up    perirectal abcess     HPI Joanna Phelps is a 59 Phelps old African American female in today to establishment of care. Complaint is hemorrhoids followed by central Joanna Phelps. Elevated blood pressure 140/85 she endorses headaches and  dizzy, she denies chest palpitation , edema and chest pain.   Past Medical History:  Diagnosis Date  . Chlamydia   . Hemorrhoids     Past Surgical History:  Procedure Laterality Date  . Boil Lanced      Family History  Problem Relation Age of Onset  . Diabetes Mother   . Hypertension Mother   . Hypertension Sister   . Hypertension Sister   . Hypertension Sister   . Hypertension Sister   . Hypertension Sister     Social History   Socioeconomic History  . Marital status: Single    Spouse name: Not on file  . Number of children: Not on file  . Years of education: Not on file  . Highest education level: Not on file  Occupational History  . Not on file  Tobacco Use  . Smoking status: Current Every Day Smoker    Types: Cigarettes  . Smokeless tobacco: Never Used  Substance and Sexual Activity  . Alcohol use: No  . Drug use: Not Currently    Types: Marijuana  . Sexual activity: Never  Other Topics Concern  . Not on file  Social History Narrative  . Not on file   Social Determinants of Health   Financial Resource Strain:   . Difficulty of Paying Living Expenses:   Food Insecurity:   . Worried About Joanna Phelps:   . Barista in the Last Phelps:   Transportation Needs:   . Freight forwarder (Medical):   Marland Kitchen Lack of Transportation (Non-Medical):   Physical Activity:   . Days of Exercise per Week:   . Minutes of Exercise per Session:   Stress:   . Feeling of Stress :   Social Connections:   .  Frequency of Communication with Friends and Family:   . Frequency of Social Gatherings with Friends and Family:   . Attends Religious Services:   . Active Member of Clubs or Organizations:   . Attends Banker Meetings:   Marland Kitchen Marital Status:   Intimate Partner Violence:   . Fear of Current or Ex-Partner:   . Emotionally Abused:   Marland Kitchen Physically Abused:   . Sexually Abused:     ROS Review of Systems  Gastrointestinal: Positive for blood in stool and rectal pain.       Hemoroids   Neurological: Positive for dizziness and headaches.    Objective:   Today's Vitals: BP 140/85 (BP Location: Right Arm, Patient Position: Sitting, Cuff Size: Normal)   Pulse 71   Temp (!) 97.3 F (36.3 C) (Temporal)   Ht 5\' 6"  (1.676 m)   Wt 205 lb 12.8 oz (93.4 kg)   LMP 03/22/2019 (Approximate)   SpO2 96%   BMI 33.22 kg/m   Physical Exam Vitals reviewed.  Constitutional:      Appearance: She is obese.  HENT:     Head: Normocephalic.     Right Ear: Tympanic membrane normal.  Left Ear: Tympanic membrane normal.  Eyes:     Extraocular Movements: Extraocular movements intact.     Pupils: Pupils are equal, round, and reactive to light.  Cardiovascular:     Rate and Rhythm: Normal rate and regular rhythm.  Pulmonary:     Effort: Pulmonary effort is normal.     Breath sounds: Normal breath sounds.  Abdominal:     General: Bowel sounds are normal.  Musculoskeletal:        General: Normal range of motion.     Cervical back: Normal range of motion and neck supple.  Skin:    General: Skin is warm and dry.  Neurological:     Mental Status: She is alert and oriented to person, place, and time.  Psychiatric:        Mood and Affect: Mood normal.        Thought Content: Thought content normal.        Judgment: Judgment normal.     Assessment & Plan:  Joanna Phelps was seen today for hospitalization follow-up.  Diagnoses and all orders for this visit:  Encounter to establish  care Juluis Mire, NP-C will be your  (PCP) she is mastered prepared . She is skilled to diagnosed and treat illness. Also able to answer health concern as well as continuing care of varied medical conditions, not limited by cause, organ system, or diagnosis.   External hemorrhoid Followed by [facility]   Elevated blood pressure reading in office without diagnosis of hypertension Counseled on blood pressure goal of less than 130/80, 2 separate readings  low-sodium, DASH diet, medication compliance, 150 minutes of moderate intensity exercise per week. Discussed medication compliance, adverse effects.  Tobacco abuse She is aware of the increased risk for lung cancer and other respiratory diseases recommend cessation.  This will be reminded at each clinical visit.   Outpatient Encounter Medications as of 05/31/2019  Medication Sig  . [DISCONTINUED] amoxicillin-clavulanate (AUGMENTIN) 875-125 MG tablet Take 1 tablet by mouth every 12 (twelve) hours.   No facility-administered encounter medications on file as of 05/31/2019.    Follow-up: No follow-ups on file.   Kerin Perna, NP

## 2019-06-11 ENCOUNTER — Encounter (INDEPENDENT_AMBULATORY_CARE_PROVIDER_SITE_OTHER): Payer: Self-pay | Admitting: Primary Care

## 2019-08-02 ENCOUNTER — Ambulatory Visit (INDEPENDENT_AMBULATORY_CARE_PROVIDER_SITE_OTHER): Payer: Self-pay | Admitting: Primary Care

## 2019-09-06 ENCOUNTER — Other Ambulatory Visit: Payer: Self-pay

## 2019-09-06 DIAGNOSIS — N63 Unspecified lump in unspecified breast: Secondary | ICD-10-CM

## 2019-09-06 NOTE — Addendum Note (Signed)
Addended by: Narda Rutherford on: 09/06/2019 11:40 AM   Modules accepted: Orders

## 2019-09-06 NOTE — Addendum Note (Signed)
Addended by: Narda Rutherford on: 09/06/2019 11:35 AM   Modules accepted: Orders

## 2019-09-21 ENCOUNTER — Other Ambulatory Visit: Payer: Self-pay

## 2019-09-21 ENCOUNTER — Ambulatory Visit: Payer: Self-pay | Admitting: *Deleted

## 2019-09-21 VITALS — BP 119/79 | Temp 97.3°F | Wt 203.8 lb

## 2019-09-21 DIAGNOSIS — Z1239 Encounter for other screening for malignant neoplasm of breast: Secondary | ICD-10-CM

## 2019-09-21 DIAGNOSIS — Z124 Encounter for screening for malignant neoplasm of cervix: Secondary | ICD-10-CM

## 2019-09-21 NOTE — Progress Notes (Signed)
[name] is a 59 y.o. G0P0 female who presents to [facility] clinic today with complaint of an area on her right outer breast that started as a blackhead pimple one year ago that became irritated in February 2021. Patient complained of pain around right nipple area x one year. Patient states the pain comes and goes. Patient rates the pain at a 6 out of 10.    Pap Smear: Pap smear completed today. Last Pap smear was over three years ago at the [facility] clinic and was normal per patient. Per patient has no history of an abnormal Pap smear. Last Pap smear result is not available in Epic.   Physical exam: Breasts Breasts symmetrical. No skin abnormalities left breast. Observed a scabbed sore right breast at 9 o'clock 7 cm from the nipple that was reddened around scab. No nipple retraction bilateral breasts. No nipple discharge bilateral breasts. No lymphadenopathy. No lumps palpated bilateral breasts. Patient complained of tenderness when palpated right outer breast at 9 o'clock at the location of the sore.       Pelvic/Bimanual Ext Genitalia No lesions, no swelling and no discharge observed on external genitalia.        Vagina Vagina pink and normal texture. No lesions or discharge observed in vagina.        Cervix Cervix is present. Cervix pink and of normal texture. No discharge observed.    Uterus Uterus is present and palpable. Uterus in normal position and normal size.        Adnexae Bilateral ovaries present and palpable. No tenderness on palpation.         Rectovaginal No rectal exam completed today since patient had no rectal complaints. No skin abnormalities observed on exam.     Smoking History: Patient is a current smoker. Discussed smoking cessation with patient. Referred to the [facility] Quitline and gave resources to the free smoking cessation classes at [facility].    Patient Navigation: Patient education provided. Access to services provided for  patient through [facility] program.   Colorectal Cancer Screening: Per patient has had colonoscopy completed on 12/12/2014. No complaints today.    Breast and Cervical Cancer Risk Assessment: Patient does not have family history of breast cancer, known genetic mutations, or radiation treatment to the chest before age 46. Patient does not have history of cervical dysplasia, immunocompromised, or DES exposure in-utero.  Risk Assessment    Risk Scores      09/21/2019   Last edited by: Narda Rutherford, LPN   5-year risk: 1.1 %   Lifetime risk: 5.9 %          A: [facility] exam with pap smear.  P: Referred patient to the [facility] for a diagnostic mammogram. Appointment scheduled Monday, October 02, 2019 at 1310.  Priscille Heidelberg, RN 09/21/2019 4:13 PM

## 2019-09-21 NOTE — Patient Instructions (Signed)
Explained breast self awareness Joanna Phelps. Let patient know B[facility]CP will cover Pap smears and HPV typing every 5 years unless has a history of abnormal Pap smears. Referred patient to the [facility] for a diagnostic mammogram. Appointment scheduled Monday, October 02, 2019 at 1310. Patient aware of appointment and will be there. Let patient know will follow up with her within the next couple weeks with results of Pap smear by letter or phone. Discussed smoking cessation with patient. Referred to the [facility] Quitline and gave resources to the free smoking cessation classes at [facility]. Joanna Phelps verbalized understanding.  Hughey Rittenberry, Kathaleen Maser, RN 3:32 PM

## 2019-09-22 ENCOUNTER — Other Ambulatory Visit: Payer: Self-pay

## 2019-09-22 LAB — CYTOLOGY - PAP
Comment: NEGATIVE
Diagnosis: NEGATIVE
High risk HPV: NEGATIVE

## 2019-10-02 ENCOUNTER — Other Ambulatory Visit: Payer: Self-pay | Admitting: Obstetrics and Gynecology

## 2019-10-02 ENCOUNTER — Other Ambulatory Visit: Payer: Self-pay

## 2019-10-02 ENCOUNTER — Ambulatory Visit
Admission: RE | Admit: 2019-10-02 | Discharge: 2019-10-02 | Disposition: A | Discharge status: Home / Self Care | Payer: No Typology Code available for payment source | Source: Ambulatory Visit | Attending: Obstetrics and Gynecology | Admitting: Obstetrics and Gynecology

## 2019-10-02 ENCOUNTER — Ambulatory Visit: Payer: Self-pay

## 2019-10-02 DIAGNOSIS — L988 Other specified disorders of the skin and subcutaneous tissue: Secondary | ICD-10-CM

## 2019-10-02 DIAGNOSIS — N63 Unspecified lump in unspecified breast: Secondary | ICD-10-CM

## 2019-10-05 ENCOUNTER — Telehealth: Payer: Self-pay

## 2019-10-05 NOTE — Telephone Encounter (Signed)
Normal Pap/HPV letter mailed to patient.  

## 2019-10-18 ENCOUNTER — Telehealth: Payer: Self-pay

## 2019-10-18 NOTE — Telephone Encounter (Signed)
Left message on identifying voicemail, requesting patient to return call to office. (Regarding dermatology consultation).

## 2019-11-08 ENCOUNTER — Telehealth: Payer: Self-pay

## 2019-11-08 NOTE — Telephone Encounter (Signed)
Attempted to contact patient to see if she has an orange card for the dermatology referral that she needs. Patient did not answer. Left name and number for patient to call back.

## 2019-12-01 ENCOUNTER — Telehealth: Payer: Self-pay | Admitting: *Deleted

## 2019-12-01 NOTE — Telephone Encounter (Signed)
LMTC x 1 for pt to discuss lung screening grant appt.

## 2019-12-12 ENCOUNTER — Ambulatory Visit (INDEPENDENT_AMBULATORY_CARE_PROVIDER_SITE_OTHER): Payer: Self-pay | Admitting: *Deleted

## 2019-12-12 NOTE — Telephone Encounter (Signed)
Unable to prescribed any pain medication no recent labs over the counter tylenol or ibuprofen is a option

## 2019-12-12 NOTE — Telephone Encounter (Signed)
Can patient be prescribed something or will she need to wait until her appointment.

## 2019-12-12 NOTE — Telephone Encounter (Signed)
Per initial encounter "Pt is having back pain that is radiating down her leg for 2 wks. Pt has an appt on 12-21-2019"; she has tried Doan's pills but it has not relieved her pains; the pt says it is across her lower back; it extends through her buttocks and down her legs; the patient says her pain is constant; the pt says she also used Bengay with no relief in symptoms; recommendations made per nurse triage protocol; she verbalized understanding; the patient sees Gwinda Passe, [facility]; will route to office for notification of encounter.  Reason for Disposition  [1] SEVERE back pain (e.g., excruciating, unable to do any normal activities) AND [2] not improved 2 hours after pain medicine  Answer Assessment - Initial Assessment Questions 1. ONSET: "When did the pain begin?"      11/28/19 2. LOCATION: "Where does it hurt?" (upper, mid or lower back) Lower back 3. SEVERITY: "How bad is the pain?"  (e.g., Scale 1-10; mild, moderate, or severe)   - MILD (1-3): doesn't interfere with normal activities    - MODERATE (4-7): interferes with normal activities or awakens from sleep    - SEVERE (8-10): excruciating pain, unable to do any normal activities      10 out of 10 4. PATTERN: "Is the pain constant?" (e.g., yes, no; constant, intermittent)      constant 5. RADIATION: "Does the pain shoot into your legs or elsewhere?"     yes 6. CAUSE:  "What do you think is causing the back pain?"     Not yet 7. BACK OVERUSE:  "Any recent lifting of heavy objects, strenuous work or exercise?"    Patient has to push wheel chairs into [place] 8. MEDICATIONS: "What have you taken so far for the pain?" (e.g., nothing, acetaminophen, NSAIDS)     Doan's pills 9. NEUROLOGIC SYMPTOMS: "Do you have any weakness, numbness, or problems with bowel/bladder control?"     Numbness and tingling in both legs 10. OTHER SYMPTOMS: "Do you have any other symptoms?" (e.g., fever, abdominal pain, burning with urination,  blood in urine)       no 11. PREGNANCY: "Is there any chance you are pregnant?" (e.g., yes, no; LMP)      LMP 10/21/19  Protocols used: BACK PAIN-A-AH

## 2019-12-14 NOTE — Telephone Encounter (Signed)
Left message informing patient that no Rx could be sent. She is overdue to labwork. PCP advises over the counter ibuprofen or tylenol.

## 2019-12-21 ENCOUNTER — Other Ambulatory Visit: Payer: Self-pay

## 2019-12-21 ENCOUNTER — Ambulatory Visit (INDEPENDENT_AMBULATORY_CARE_PROVIDER_SITE_OTHER): Payer: 59 | Admitting: Primary Care

## 2019-12-21 ENCOUNTER — Encounter (INDEPENDENT_AMBULATORY_CARE_PROVIDER_SITE_OTHER): Payer: Self-pay | Admitting: Primary Care

## 2019-12-21 VITALS — BP 142/83 | HR 59 | Temp 97.3°F | Ht 66.0 in | Wt 205.6 lb

## 2019-12-21 DIAGNOSIS — M545 Low back pain, unspecified: Secondary | ICD-10-CM | POA: Diagnosis not present

## 2019-12-21 DIAGNOSIS — Z13220 Encounter for screening for lipoid disorders: Secondary | ICD-10-CM

## 2019-12-21 DIAGNOSIS — Z23 Encounter for immunization: Secondary | ICD-10-CM | POA: Diagnosis not present

## 2019-12-21 DIAGNOSIS — Z1159 Encounter for screening for other viral diseases: Secondary | ICD-10-CM

## 2019-12-21 DIAGNOSIS — Z1211 Encounter for screening for malignant neoplasm of colon: Secondary | ICD-10-CM | POA: Diagnosis not present

## 2019-12-21 DIAGNOSIS — Z114 Encounter for screening for human immunodeficiency virus [HIV]: Secondary | ICD-10-CM

## 2019-12-21 DIAGNOSIS — I10 Essential (primary) hypertension: Secondary | ICD-10-CM

## 2019-12-21 DIAGNOSIS — G8929 Other chronic pain: Secondary | ICD-10-CM

## 2019-12-21 MED ORDER — HYDROCHLOROTHIAZIDE 25 MG PO TABS: 25.0000 mg | ORAL_TABLET | Freq: Every day | ORAL | 0 refills | Status: DC

## 2019-12-21 NOTE — Patient Instructions (Addendum)
Schedule follow up with clinical social worker    Hypertension, Adult Hypertension is another name for high blood pressure. High blood pressure forces your heart to work harder to pump blood. This can cause problems over time. There are two numbers in a blood pressure reading. There is a top number (systolic) over a bottom number (diastolic). It is best to have a blood pressure that is below 120/80. Healthy choices can help lower your blood pressure, or you may need medicine to help lower it. What are the causes? The cause of this condition is not known. Some conditions may be related to high blood pressure. What increases the risk?  Smoking.  Having type 2 diabetes mellitus, high cholesterol, or both.  Not getting enough exercise or physical activity.  Being overweight.  Having too much fat, sugar, calories, or salt (sodium) in your diet.  Drinking too much alcohol.  Having long-term (chronic) kidney disease.  Having a family history of high blood pressure.  Age. Risk increases with age.  Race. You may be at higher risk if you are African American.  Gender. Men are at higher risk than women before age 75. After age 15, women are at higher risk than men.  Having obstructive sleep apnea.  Stress. What are the signs or symptoms?  High blood pressure may not cause symptoms. Very high blood pressure (hypertensive crisis) may cause: ? Headache. ? Feelings of worry or nervousness (anxiety). ? Shortness of breath. ? Nosebleed. ? A feeling of being sick to your stomach (nausea). ? Throwing up (vomiting). ? Changes in how you see. ? Very bad chest pain. ? Seizures. How is this treated?  This condition is treated by making healthy lifestyle changes, such as: ? Eating healthy foods. ? Exercising more. ? Drinking less alcohol.  Your health care provider may prescribe medicine if lifestyle changes are not enough to get your blood pressure under control, and if: ? Your top  number is above 130. ? Your bottom number is above 80.  Your personal target blood pressure may vary. Follow these instructions at home: Eating and drinking   If told, follow the DASH eating plan. To follow this plan: ? Fill one half of your plate at each meal with fruits and vegetables. ? Fill one fourth of your plate at each meal with whole grains. Whole grains include whole-wheat pasta, brown rice, and whole-grain bread. ? Eat or drink low-fat dairy products, such as skim milk or low-fat yogurt. ? Fill one fourth of your plate at each meal with low-fat (lean) proteins. Low-fat proteins include fish, chicken without skin, eggs, beans, and tofu. ? Avoid fatty meat, cured and processed meat, or chicken with skin. ? Avoid pre-made or processed food.  Eat less than 1,500 mg of salt each day.  Do not drink alcohol if: ? Your doctor tells you not to drink. ? You are pregnant, may be pregnant, or are planning to become pregnant.  If you drink alcohol: ? Limit how much you use to:  0-1 drink a day for women.  0-2 drinks a day for men. ? Be aware of how much alcohol is in your drink. In the [place] one drink equals one 12 oz bottle of beer (355 mL), one 5 oz glass of wine (148 mL), or one 1 oz glass of hard liquor (44 mL). Lifestyle   Work with your doctor to stay at a healthy weight or to lose weight. Ask your doctor what the best weight is for  you.  Get at least 30 minutes of exercise most days of the week. This may include walking, swimming, or biking.  Get at least 30 minutes of exercise that strengthens your muscles (resistance exercise) at least 3 days a week. This may include lifting weights or doing Pilates.  Do not use any products that contain nicotine or tobacco, such as cigarettes, e-cigarettes, and chewing tobacco. If you need help quitting, ask your doctor.  Check your blood pressure at home as told by your doctor.  Keep all follow-up visits as told by your doctor.  This is important. Medicines  Take over-the-counter and prescription medicines only as told by your doctor. Follow directions carefully.  Do not skip doses of blood pressure medicine. The medicine does not work as well if you skip doses. Skipping doses also puts you at risk for problems.  Ask your doctor about side effects or reactions to medicines that you should watch for. Contact a doctor if you:  Think you are having a reaction to the medicine you are taking.  Have headaches that keep coming back (recurring).  Feel dizzy.  Have swelling in your ankles.  Have trouble with your vision. Get help right away if you:  Get a very bad headache.  Start to feel mixed up (confused).  Feel weak or numb.  Feel faint.  Have very bad pain in your: ? Chest. ? Belly (abdomen).  Throw up more than once.  Have trouble breathing. Summary  Hypertension is another name for high blood pressure.  High blood pressure forces your heart to work harder to pump blood.  For most people, a normal blood pressure is less than 120/80.  Making healthy choices can help lower blood pressure. If your blood pressure does not get lower with healthy choices, you may need to take medicine. This information is not intended to replace advice given to you by your health care provider. Make sure you discuss any questions you have with your health care provider. Document Revised: 11/10/2017 Document Reviewed: 11/10/2017 Elsevier Patient Education  [employer]. Follow-up with clinical social work Back Pain in Pregnancy Back pain during pregnancy is common. Back pain may be caused by several factors that are related to changes during your pregnancy. Follow these instructions at home: Managing pain, stiffness, and swelling    If directed, for sudden (acute) back pain, put ice on the painful area. ? Put ice in a plastic bag. ? Place a towel between your skin and the bag. ? Leave the ice on for 20  minutes, 2-3 times per day.  If directed, apply heat to the affected area before you exercise. Use the heat source that your health care provider recommends, such as a moist heat pack or a heating pad. ? Place a towel between your skin and the heat source. ? Leave the heat on for 20-30 minutes. ? Remove the heat if your skin turns bright red. This is especially important if you are unable to feel pain, heat, or cold. You may have a greater risk of getting burned.  If directed, massage the affected area. Activity  Exercise as told by your health care provider. Gentle exercise is the best way to prevent or manage back pain.  Listen to your body when lifting. If lifting hurts, ask for help or bend your knees. This uses your leg muscles instead of your back muscles.  Squat down when picking up something from the floor. Do not bend over.  Only use bed  rest for short periods as told by your health care provider. Bed rest should only be used for the most severe episodes of back pain. Standing, sitting, and lying down  Do not stand in one place for long periods of time.  Use good posture when sitting. Make sure your head rests over your shoulders and is not hanging forward. Use a pillow on your lower back if necessary.  Try sleeping on your side, preferably the left side, with a pregnancy support pillow or 1-2 regular pillows between your legs. ? If you have back pain after a night's rest, your bed may be too soft. ? A firm mattress may provide more support for your back during pregnancy. General instructions  Do not wear high heels.  Eat a healthy diet. Try to gain weight within your health care provider's recommendations.  Use a maternity girdle, elastic sling, or back brace as told by your health care provider.  Take over-the-counter and prescription medicines only as told by your health care provider.  Work with a physical therapist or massage therapist to find ways to manage back  pain. Acupuncture or massage therapy may be helpful.  Keep all follow-up visits as told by your health care provider. This is important. Contact a health care provider if:  Your back pain interferes with your daily activities.  You have increasing pain in other parts of your body. Get help right away if:  You develop numbness, tingling, weakness, or problems with the use of your arms or legs.  You develop severe back pain that is not controlled with medicine.  You have a change in bowel or bladder control.  You develop shortness of breath, dizziness, or you faint.  You develop nausea, vomiting, or sweating.  You have back pain that is a rhythmic, cramping pain similar to labor pains. Labor pain is usually 1-2 minutes apart, lasts for about 1 minute, and involves a bearing down feeling or pressure in your pelvis.  You have back pain and your water breaks or you have vaginal bleeding.  You have back pain or numbness that travels down your leg.  Your back pain developed after you fell.  You develop pain on one side of your back.  You see blood in your urine.  You develop skin blisters in the area of your back pain. Summary  Back pain may be caused by several factors that are related to changes during your pregnancy.  Follow instructions as told by your health care provider for managing pain, stiffness, and swelling.  Exercise as told by your health care provider. Gentle exercise is the best way to prevent or manage back pain.  Take over-the-counter and prescription medicines only as told by your health care provider.  Keep all follow-up visits as told by your health care provider. This is important. This information is not intended to replace advice given to you by your health care provider. Make sure you discuss any questions you have with your health care provider. Document Revised: 06/21/2018 Document Reviewed: 08/18/2017 Elsevier Patient Education  2020 [employer].

## 2019-12-21 NOTE — Progress Notes (Signed)
Established Patient Office Visit  Subjective:  Patient ID: Joanna Phelps, female    DOB: [birth year]  Age: 59 y.o. MRN: [id number]  CC:  Chief Complaint  Patient presents with  . Back Pain    HPI Joanna Phelps is a 59 year old female who presents for back pain, wrist pain and hand pain.  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  She has presently tried to take care of this herself by purchasing a back brace and a cushion for her chair when she is driving.  Neither one of these devices seem to help.  Past Medical History:  Diagnosis Date  . Chlamydia   . Hemorrhoids     Past Surgical History:  Procedure Laterality Date  . Boil Lanced      Family History  Problem Relation Age of Onset  . Diabetes Mother   . Hypertension Mother   . Hypertension Sister   . Diabetes Sister   . Hypertension Sister   . Hypertension Sister   . Hypertension Sister   . Hypertension Sister     Social History   Socioeconomic History  . Marital status: Single    Spouse name: Not on file  . Number of children: 0  . Years of education: Not on file  . Highest education level: 11th grade  Occupational History  . Not on file  Tobacco Use  . Smoking status: Current Every Day Smoker    Types: Cigarettes  . Smokeless tobacco: Never Used  Vaping Use  . Vaping Use: Never used  Substance and Sexual Activity  . Alcohol use: No  . Drug use: Not Currently    Types: Marijuana  . Sexual activity: Yes    Birth control/protection: None  Other Topics Concern  . Not on file  Social History Narrative  . Not on file   Social Determinants of Health   Financial Resource Strain:   . Difficulty of Paying Living Expenses: Not on file  Food Insecurity:   . Worried About Charity fundraiser in the Last Year: Not on file  . Ran Out of Food in the Last Year: Not on file  Transportation Needs: No Transportation Needs  . Lack of  Transportation (Medical): No  . Lack of Transportation (Non-Medical): No  Physical Activity:   . Days of Exercise per Week: Not on file  . Minutes of Exercise per Session: Not on file  Stress:   . Feeling of Stress : Not on file  Social Connections:   . Frequency of Communication with Friends and Family: Not on file  . Frequency of Social Gatherings with Friends and Family: Not on file  . Attends Religious Services: Not on file  . Active Member of Clubs or Organizations: Not on file  . Attends Archivist Meetings: Not on file  . Marital Status: Not on file  Intimate Partner Violence:   . Fear of Current or Ex-Partner: Not on file  . Emotionally Abused: Not on file  . Physically Abused: Not on file  . Sexually Abused: Not on file    No outpatient medications prior to visit.   No facility-administered medications prior to visit.    Allergies  Allergen Reactions  . Ibuprofen Itching and Rash    ROS Review of Systems  Constitutional: Positive for fatigue.  Musculoskeletal: Positive for arthralgias, back pain and gait problem.  All other systems reviewed and are negative.  Objective:    Physical Exam Vitals reviewed.  Constitutional:      Appearance: Normal appearance. She is obese.  HENT:     Head: Normocephalic.     Right Ear: Tympanic membrane normal.     Left Ear: Tympanic membrane normal.  Eyes:     Extraocular Movements: Extraocular movements intact.  Cardiovascular:     Rate and Rhythm: Normal rate and regular rhythm.  Pulmonary:     Effort: Pulmonary effort is normal.     Breath sounds: Normal breath sounds.  Abdominal:     General: Bowel sounds are normal. There is distension.  Musculoskeletal:        General: Normal range of motion.     Cervical back: Normal range of motion and neck supple.  Skin:    General: Skin is warm and dry.  Neurological:     Mental Status: She is alert and oriented to person, place, and time.  Psychiatric:         Mood and Affect: Mood normal.        Behavior: Behavior normal.        Thought Content: Thought content normal.        Judgment: Judgment normal.     BP (!) 142/83 (BP Location: Right Arm, Patient Position: Sitting, Cuff Size: Large)   Pulse (!) 59   Temp (!) 97.3 F (36.3 C) (Temporal)   Ht 5' 6"  (1.676 m)   Wt 205 lb 9.6 oz (93.3 kg)   LMP 10/21/2019 (Approximate)   SpO2 99%   BMI 33.18 kg/m  Wt Readings from Last 3 Encounters:  12/21/19 205 lb 9.6 oz (93.3 kg)  09/21/19 203 lb 12.8 oz (92.4 kg)  05/31/19 205 lb 12.8 oz (93.4 kg)     Health Maintenance Due  Topic Date Due  . Hepatitis C Screening  Never done  . COVID-19 Vaccine (1) Never done  . COLONOSCOPY  Never done    There are no preventive care reminders to display for this patient.  Lab Results  Component Value Date   TSH 2.588 07/16/2009   Lab Results  Component Value Date   WBC 5.3 12/21/2019   HGB 14.3 12/21/2019   HCT 43.0 12/21/2019   MCV 85 12/21/2019   PLT 259 12/21/2019     Assessment & Plan:  Sayre was seen today for back pain.  Diagnoses and all orders for this visit:  Need for immunization against influenza CDC recommends influenza vaccine yearly.   -     Flu Vaccine QUAD 36+ mos IM  Chronic bilateral low back pain, unspecified whether sciatica present BACK PAIN  Location: lumbar/sacral Quality: aching, pressure, shooting, uncomfortable and with paresthesia Onset: gradual Worse with: siting a period of time       Better with: rest  Radiation: none Trauma: none  Best sitting/standing/leaning forward: yes  Red Flags Fecal/urinary incontinence: no  Numbness/Weakness: no  Fever/chills/sweats: no  Night pain: yes  Unexplained weight loss: no  No relief with bedrest: no  h/o cancer/immunosuppression: no  IV drug use: no  PMH of osteoporosis or chronic steroid use: no  -     CMP14+EGFR -     CBC with Differential -     Sedimentation Rate  Colon cancer screening -      Ambulatory referral to Gastroenterology  Essential hypertension, benign Counseled on blood pressure goal of less than 130/80, low-sodium, DASH diet, medication compliance, 150 minutes of moderate intensity exercise per week. Discussed medication compliance,  adverse effects. Started HCTZ 17m  -     Lipid Panel -     CMP14+EGFR -     CBC with Differential  Lipid screening Lipid panel    Meds ordered this encounter  Medications  . hydrochlorothiazide (HYDRODIURIL) 25 MG tablet    Sig: Take 1 tablet (25 mg total) by mouth daily.    Dispense:  30 tablet    Refill:  0    Follow-up: Return in about 4 weeks (around 01/18/2020) for in person HTN.    MKerin Perna NP

## 2019-12-22 ENCOUNTER — Other Ambulatory Visit (INDEPENDENT_AMBULATORY_CARE_PROVIDER_SITE_OTHER): Payer: Self-pay | Admitting: Primary Care

## 2019-12-22 DIAGNOSIS — E785 Hyperlipidemia, unspecified: Secondary | ICD-10-CM

## 2019-12-22 LAB — CBC WITH DIFFERENTIAL/PLATELET
Basophils Absolute: 0.1 10*3/uL (ref 0.0–0.2)
Basos: 1 %
EOS (ABSOLUTE): 0.1 10*3/uL (ref 0.0–0.4)
Eos: 2 %
Hematocrit: 43 % (ref 34.0–46.6)
Hemoglobin: 14.3 g/dL (ref 11.1–15.9)
Immature Grans (Abs): 0 10*3/uL (ref 0.0–0.1)
Immature Granulocytes: 0 %
Lymphocytes Absolute: 3.1 10*3/uL (ref 0.7–3.1)
Lymphs: 57 %
MCH: 28.1 pg (ref 26.6–33.0)
MCHC: 33.3 g/dL (ref 31.5–35.7)
MCV: 85 fL (ref 79–97)
Monocytes Absolute: 0.6 10*3/uL (ref 0.1–0.9)
Monocytes: 11 %
Neutrophils Absolute: 1.5 10*3/uL (ref 1.4–7.0)
Neutrophils: 29 %
Platelets: 259 10*3/uL (ref 150–450)
RBC: 5.08 x10E6/uL (ref 3.77–5.28)
RDW: 13.7 % (ref 11.7–15.4)
WBC: 5.3 10*3/uL (ref 3.4–10.8)

## 2019-12-22 LAB — CMP14+EGFR
ALT: 10 IU/L (ref 0–32)
AST: 12 IU/L (ref 0–40)
Albumin/Globulin Ratio: 1.5 (ref 1.2–2.2)
Albumin: 4.2 g/dL (ref 3.8–4.9)
Alkaline Phosphatase: 75 IU/L (ref 44–121)
BUN/Creatinine Ratio: 12 (ref 9–23)
BUN: 10 mg/dL (ref 6–24)
Bilirubin Total: <0.2 mg/dL (ref 0.0–1.2)
CO2: 25 mmol/L (ref 20–29)
Calcium: 9 mg/dL (ref 8.7–10.2)
Chloride: 104 mmol/L (ref 96–106)
Creatinine, Ser: 0.82 mg/dL (ref 0.57–1.00)
GFR calc Af Amer: 92 mL/min/{1.73_m2} (ref >59)
GFR calc non Af Amer: 80 mL/min/{1.73_m2} (ref >59)
Globulin, Total: 2.8 g/dL (ref 1.5–4.5)
Glucose: 87 mg/dL (ref 65–99)
Potassium: 4.4 mmol/L (ref 3.5–5.2)
Sodium: 141 mmol/L (ref 134–144)
Total Protein: 7 g/dL (ref 6.0–8.5)

## 2019-12-22 LAB — LIPID PANEL
Chol/HDL Ratio: 5.7 ratio — ABNORMAL HIGH (ref 0.0–4.4)
Cholesterol, Total: 178 mg/dL (ref 100–199)
HDL: 31 mg/dL — ABNORMAL LOW (ref >39)
LDL Chol Calc (NIH): 130 mg/dL — ABNORMAL HIGH (ref 0–99)
Triglycerides: 93 mg/dL (ref 0–149)
VLDL Cholesterol Cal: 17 mg/dL (ref 5–40)

## 2019-12-22 LAB — SEDIMENTATION RATE: Sed Rate: 52 mm/hr — ABNORMAL HIGH (ref 0–40)

## 2019-12-22 MED ORDER — ATORVASTATIN CALCIUM 20 MG PO TABS: 20.0000 mg | ORAL_TABLET | Freq: Every day | ORAL | 3 refills | Status: DC

## 2019-12-26 NOTE — Telephone Encounter (Signed)
LMTC x 1  

## 2020-01-02 ENCOUNTER — Other Ambulatory Visit: Payer: Self-pay

## 2020-01-02 ENCOUNTER — Ambulatory Visit (INDEPENDENT_AMBULATORY_CARE_PROVIDER_SITE_OTHER): Payer: 59 | Admitting: Licensed Clinical Social Worker

## 2020-01-02 DIAGNOSIS — F4323 Adjustment disorder with mixed anxiety and depressed mood: Secondary | ICD-10-CM

## 2020-01-04 NOTE — Telephone Encounter (Signed)
Will close this message per office policy. Unable to contact pt.

## 2020-01-08 ENCOUNTER — Other Ambulatory Visit: Payer: Self-pay

## 2020-01-08 ENCOUNTER — Ambulatory Visit (INDEPENDENT_AMBULATORY_CARE_PROVIDER_SITE_OTHER): Payer: 59 | Admitting: Family Medicine

## 2020-01-08 ENCOUNTER — Ambulatory Visit (INDEPENDENT_AMBULATORY_CARE_PROVIDER_SITE_OTHER): Payer: 59

## 2020-01-08 ENCOUNTER — Encounter: Payer: Self-pay | Admitting: Family Medicine

## 2020-01-08 DIAGNOSIS — G8929 Other chronic pain: Secondary | ICD-10-CM

## 2020-01-08 DIAGNOSIS — M545 Low back pain, unspecified: Secondary | ICD-10-CM | POA: Diagnosis not present

## 2020-01-08 MED ORDER — CELECOXIB 200 MG PO CAPS: 200.0000 mg | ORAL_CAPSULE | Freq: Two times a day (BID) | ORAL | 6 refills | Status: DC | PRN

## 2020-01-08 MED ORDER — BACLOFEN 10 MG PO TABS: 5.0000 mg | ORAL_TABLET | Freq: Every evening | ORAL | 3 refills | Status: DC | PRN

## 2020-01-08 NOTE — Progress Notes (Signed)
Office Visit Note   Patient: Joanna Phelps           Date of Birth: September 13, 1960           MRN: [id number] Visit Date: 01/08/2020 Requested by: Grayce Sessions, NP [id number] [place],  [place] [address] PCP: Grayce Sessions, NP  Subjective: Chief Complaint  Patient presents with   Lower Back - Pain    Pain started at the end of last year or early this year. Pain across the lower back. Now having pain radiating down the lateral thighs to the knees, and occasionally into the groin area (sharp pain). Numbness in the feet.    HPI: She is here with low back pain.  Symptoms started sometime toward the end of last year, no injury.  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  There was never 1 moment where she injured herself.  Pain does not radiate down the legs but it does occasionally radiate into the groin area.  She feels better when walking, worse when sitting.  No bowel or bladder dysfunction.                ROS:   All other systems were reviewed and are negative.  Objective: Vital Signs: There were no vitals taken for this visit.  Physical Exam:  General:  Alert and oriented, in no acute distress. Pulm:  Breathing unlabored. Psy:  Normal mood, congruent affect. Skin: No rash Low back: She has some tenderness in the L5-S1 area.  Mild pain in the gluteus medius region.  No pain over the SI joints or in the sciatic notch.  Straight leg raise negative, no pain with internal hip rotation and she has good hip range of motion.  Lower extremity strength and reflexes are normal.  Imaging: XR Lumbar Spine 2-3 Views  Result Date: 01/08/2020 X-rays lumbar spine reveal mild to moderate degenerative disc disease at L4-5 and L5-S1.  No sign of compression fracture or neoplasm.  She has facet degenerative change at the lower levels.  Hip joints have very early arthritic change, no sign of AVN.   Assessment & Plan: 1.  Chronic low back pain,  question lumbar disc protrusion.  Neurologic exam is nonfocal. -Medications given, physical therapy referral.  If symptoms persist, MRI scan versus epidural steroid injection.     Procedures: No procedures performed  No notes on file     PMFS History: There are no problems to display for this patient.  Past Medical History:  Diagnosis Date   Chlamydia    Hemorrhoids     Family History  Problem Relation Age of Onset   Diabetes Mother    Hypertension Mother    Hypertension Sister    Diabetes Sister    Hypertension Sister    Hypertension Sister    Hypertension Sister    Hypertension Sister     Past Surgical History:  Procedure Laterality Date   Boil Lanced     Social History   Occupational History   Not on file  Tobacco Use   Smoking status: Current Every Day Smoker    Types: Cigarettes   Smokeless tobacco: Never Used  Building services engineer Use: Never used  Substance and Sexual Activity   Alcohol use: No   Drug use: Not Currently    Types: Marijuana   Sexual activity: Yes    Birth control/protection: None

## 2020-01-12 ENCOUNTER — Other Ambulatory Visit (INDEPENDENT_AMBULATORY_CARE_PROVIDER_SITE_OTHER): Payer: Self-pay | Admitting: Primary Care

## 2020-01-12 DIAGNOSIS — I10 Essential (primary) hypertension: Secondary | ICD-10-CM

## 2020-01-15 ENCOUNTER — Ambulatory Visit: Payer: 59 | Admitting: Licensed Clinical Social Worker

## 2020-01-18 ENCOUNTER — Other Ambulatory Visit: Payer: Self-pay

## 2020-01-18 ENCOUNTER — Ambulatory Visit (INDEPENDENT_AMBULATORY_CARE_PROVIDER_SITE_OTHER): Payer: 59 | Admitting: Primary Care

## 2020-01-18 ENCOUNTER — Encounter (INDEPENDENT_AMBULATORY_CARE_PROVIDER_SITE_OTHER): Payer: Self-pay | Admitting: Primary Care

## 2020-01-18 VITALS — BP 142/91 | HR 63 | Temp 97.2°F | Ht 66.0 in | Wt 205.2 lb

## 2020-01-18 DIAGNOSIS — I10 Essential (primary) hypertension: Secondary | ICD-10-CM

## 2020-01-18 DIAGNOSIS — Z720 Tobacco use: Secondary | ICD-10-CM | POA: Diagnosis not present

## 2020-01-18 DIAGNOSIS — H539 Unspecified visual disturbance: Secondary | ICD-10-CM | POA: Diagnosis not present

## 2020-01-18 DIAGNOSIS — F32A Depression, unspecified: Secondary | ICD-10-CM | POA: Diagnosis not present

## 2020-01-18 MED ORDER — HYDROCHLOROTHIAZIDE 25 MG PO TABS: 25.0000 mg | ORAL_TABLET | Freq: Every day | ORAL | 1 refills | Status: DC

## 2020-01-18 MED ORDER — AMLODIPINE BESYLATE 10 MG PO TABS: 10.0000 mg | ORAL_TABLET | Freq: Every day | ORAL | 3 refills | Status: DC

## 2020-01-18 MED ORDER — BUPROPION HCL ER (SR) 150 MG PO TB12: 150.0000 mg | ORAL_TABLET | Freq: Two times a day (BID) | ORAL | 1 refills | Status: DC

## 2020-01-18 NOTE — Patient Instructions (Signed)
Bupropion sustained-release tablets (smoking cessation) What is this medicine? BUPROPION (byoo PROE pee on) is used to help people quit smoking. This medicine may be used for other purposes; ask your health care provider or pharmacist if you have questions. COMMON BRAND NAME(S): Buproban, Zyban What should I tell my health care provider before I take this medicine? They need to know if you have any of these conditions:  an eating disorder, such as anorexia or bulimia  bipolar disorder or psychosis  diabetes or high blood sugar, treated with medication  glaucoma  head injury or brain tumor  heart disease, previous heart attack, or irregular heart beat  high blood pressure  kidney or liver disease  seizures  suicidal thoughts or a previous suicide attempt  Tourette's syndrome  weight loss  an unusual or allergic reaction to bupropion, other medicines, foods, dyes, or preservatives  breast-feeding  pregnant or trying to become pregnant How should I use this medicine? Take this medicine by mouth with a glass of water. Follow the directions on the prescription label. You can take it with or without food. If it upsets your stomach, take it with food. Do not cut, crush or chew this medicine. Take your medicine at regular intervals. If you take this medicine more than once a day, take your second dose at least 8 hours after you take your first dose. To limit difficulty in sleeping, avoid taking this medicine at bedtime. Do not take your medicine more often than directed. Do not stop taking this medicine suddenly except upon the advice of your doctor. Stopping this medicine too quickly may cause serious side effects. A special MedGuide will be given to you by the pharmacist with each prescription and refill. Be sure to read this information carefully each time. Talk to your pediatrician regarding the use of this medicine in children. Special care may be needed. Overdosage: If you  think you have taken too much of this medicine contact a poison control center or emergency room at once. NOTE: This medicine is only for you. Do not share this medicine with others. What if I miss a dose? If you miss a dose, skip the missed dose and take your next tablet at the regular time. There should be at least 8 hours between doses. Do not take double or extra doses. What may interact with this medicine? Do not take this medicine with any of the following medications:  linezolid  MAOIs like Azilect, Carbex, Eldepryl, Marplan, Nardil, and Parnate  methylene blue (injected into a vein)  other medicines that contain bupropion like Wellbutrin This medicine may also interact with the following medications:  alcohol  certain medicines for anxiety or sleep  certain medicines for blood pressure like metoprolol, propranolol  certain medicines for depression or psychotic disturbances  certain medicines for HIV or AIDS like efavirenz, lopinavir, nelfinavir, ritonavir  certain medicines for irregular heart beat like propafenone, flecainide  certain medicines for Parkinson's disease like amantadine, levodopa  certain medicines for seizures like carbamazepine, phenytoin, phenobarbital  cimetidine  clopidogrel  cyclophosphamide  digoxin  furazolidone  isoniazid  nicotine  orphenadrine  procarbazine  steroid medicines like prednisone or cortisone  stimulant medicines for attention disorders, weight loss, or to stay awake  tamoxifen  theophylline  thiotepa  ticlopidine  tramadol  warfarin This list may not describe all possible interactions. Give your health care provider a list of all the medicines, herbs, non-prescription drugs, or dietary supplements you use. Also tell them if you smoke,   drink alcohol, or use illegal drugs. Some items may interact with your medicine. What should I watch for while using this medicine? Visit your doctor or healthcare provider  for regular checks on your progress. This medicine should be used together with a patient support program. It is important to participate in a behavioral program, counseling, or other support program that is recommended by your healthcare provider. This medicine may cause serious skin reactions. They can happen weeks to months after starting the medicine. Contact your healthcare provider right away if you notice fevers or flu-like symptoms with a rash. The rash may be red or purple and then turn into blisters or peeling of the skin. Or, you might notice a red rash with swelling of the face, lips or lymph nodes in your neck or under your arms. Patients and their families should watch out for new or worsening thoughts of suicide or depression. Also watch out for sudden changes in feelings such as feeling anxious, agitated, panicky, irritable, hostile, aggressive, impulsive, severely restless, overly excited and hyperactive, or not being able to sleep. If this happens, especially at the beginning of treatment or after a change in dose, call your healthcare provider. Avoid alcoholic drinks while taking this medicine. Drinking excessive alcoholic beverages, using sleeping or anxiety medicines, or quickly stopping the use of these agents while taking this medicine may increase your risk for a seizure. Do not drive or use heavy machinery until you know how this medicine affects you. This medicine can impair your ability to perform these tasks. Do not take this medicine close to bedtime. It may prevent you from sleeping. Your mouth may get dry. Chewing sugarless gum or sucking hard candy, and drinking plenty of water may help. Contact your doctor if the problem does not go away or is severe. Do not use nicotine patches or chewing gum without the advice of your doctor or healthcare provider while taking this medicine. You may need to have your blood pressure taken regularly if your doctor recommends that you use both  nicotine and this medicine together. What side effects may I notice from receiving this medicine? Side effects that you should report to your doctor or health care professional as soon as possible:  allergic reactions like skin rash, itching or hives, swelling of the face, lips, or tongue  breathing problems  changes in vision  confusion  elevated mood, decreased need for sleep, racing thoughts, impulsive behavior  fast or irregular heartbeat  hallucinations, loss of contact with reality  increased blood pressure  rash, fever, and swollen lymph nodes  redness, blistering, peeling, or loosening of the skin, including inside the mouth  seizures  suicidal thoughts or other mood changes  unusually weak or tired  vomiting Side effects that usually do not require medical attention (report to your doctor or health care professional if they continue or are bothersome):  constipation  headache  loss of appetite  nausea  tremors  weight loss This list may not describe all possible side effects. Call your doctor for medical advice about side effects. You may report side effects to FDA at 1-800-FDA-1088. Where should I keep my medicine? Keep out of the reach of children. Store at room temperature between 20 and 25 degrees C (68 and 77 degrees F). Protect from light. Keep container tightly closed. Throw away any unused medicine after the expiration date. NOTE: This sheet is a summary. It may not cover all possible information. If you have questions about this medicine,   talk to your doctor, pharmacist, or health care provider.  2020 Elsevier/Gold Standard (2018-05-26 13:59:09)  

## 2020-01-18 NOTE — Progress Notes (Signed)
[facility]    Joanna Phelps. Joanna Phelps for  hypertension evaluation, on previous visit medication was adjusted to include adding HCTZ 25mg  - rushing today and did not take her Bp meds. Reading at home have been systolic 140-142 diastolic 86-92  Patient reports adherence with medications.  Current Medication List Current Outpatient Medications on File Prior to Visit  Medication Sig Dispense Refill  . atorvastatin (LIPITOR) 20 MG tablet Take 1 tablet (20 mg total) by mouth daily. 90 tablet 3  . baclofen (LIORESAL) 10 MG tablet Take 0.5-1 tablets (5-10 mg total) by mouth at bedtime as needed for muscle spasms. 30 each 3  . celecoxib (CELEBREX) 200 MG capsule Take 1 capsule (200 mg total) by mouth 2 (two) times daily as needed. 60 capsule 6  . hydrochlorothiazide (HYDRODIURIL) 25 MG tablet TAKE 1 TABLET BY MOUTH EVERY DAY 30 tablet 0   No current facility-administered medications on file prior to visit.   Past Medical History  Past Medical History:  Diagnosis Date  . Chlamydia   . Hemorrhoids    Dietary habits include: monitoring  Exercise habits include:walking  Family / Social history: mother -MI  ASCVD risk factors include-  O:  Physical Exam Vitals reviewed.  Constitutional:      Appearance: Joanna Phelps is obese.  HENT:     Head: Normocephalic.  Cardiovascular:     Rate and Rhythm: Normal rate and regular rhythm.  Pulmonary:     Effort: Pulmonary effort is normal.     Breath sounds: Normal breath sounds.  Abdominal:     General: Bowel sounds are normal. There is distension.     Palpations: Abdomen is soft.  Musculoskeletal:        General: Normal range of motion.     Cervical back: Normal range of motion and neck supple.  Skin:    General: Skin is warm and dry.  Neurological:     Mental Status: Joanna Phelps is alert and oriented to person, place, and time.  Psychiatric:        Mood and Affect: Mood normal.        Behavior: Behavior normal.        Thought  Content: Thought content normal.        Judgment: Judgment normal.      Review of Systems  Eyes:       Vision changes   Neurological: Positive for headaches.       Stress  Psychiatric/Behavioral:       Stress  All other systems reviewed and are negative.   Last 3 Office BP readings: BP Readings from Last 3 Encounters:  01/18/20 (!) 142/91  12/21/19 (!) 142/83  09/21/19 119/79    BMET    Component Value Date/Time   NA 141 12/21/2019 1147   K 4.4 12/21/2019 1147   CL 104 12/21/2019 1147   CO2 25 12/21/2019 1147   GLUCOSE 87 12/21/2019 1147   GLUCOSE 148 (H) 03/09/2011 0327   BUN 10 12/21/2019 1147   CREATININE 0.82 12/21/2019 1147   CALCIUM 9.0 12/21/2019 1147   GFRNONAA 80 12/21/2019 1147   GFRAA 92 12/21/2019 1147    Renal function: CrCl cannot be calculated (Patient's most recent lab result is older than the maximum 21 days allowed.).  Clinical ASCVD: Yes  The 10-year ASCVD risk score 02/20/2020 DC Jr., et al., 2013) is: 20.9%   Values used to calculate the score:     Age: 59 years     Sex:  Female     Is Non-Hispanic African American: Yes     Diabetic: No     Tobacco smoker: Yes     Systolic Blood Pressure: 142 mmHg     Is BP treated: Yes     HDL Cholesterol: 31 mg/dL     Total Cholesterol: 178 mg/dL   Joanna Phelps was seen today for hypertension.  Diagnoses and all orders for this visit:   A/P :Essential hypertension, benign Hypertension newly diagnosed currently HCTZ 25mg  on current medications. BP Goal = 130/80 mmHg. Patient is adherent with current medications.  -Added amlodipine 10mg  daily  -F/u labs ordered - none -Counseled on lifestyle modifications for blood pressure control including reduced dietary sodium, increased exercise, adequate sleep  Tobacco abuse  Joanna Phelps is aware of  Increased risk for lung cancer and other respiratory diseases recommend cessation.  This will be reminded at each clinical visit. Start Wellbutrin dual purpose smoking cessation  and depression   Depression, unspecified depression type   Office Visit from 01/18/2020 in [facility]  PHQ-9 Total Score 23    Treatment for depression and f/u with CSW Bupropion sustained-release tablets  13/06/2019

## 2020-01-22 ENCOUNTER — Other Ambulatory Visit: Payer: Self-pay

## 2020-01-22 ENCOUNTER — Encounter (HOSPITAL_COMMUNITY): Payer: Self-pay

## 2020-01-22 ENCOUNTER — Emergency Department (HOSPITAL_COMMUNITY): Payer: Worker's Compensation

## 2020-01-22 ENCOUNTER — Emergency Department (HOSPITAL_COMMUNITY)
Admission: EM | Admit: 2020-01-22 | Discharge: 2020-01-22 | Disposition: A | Discharge status: Home / Self Care | Payer: Worker's Compensation | Attending: Emergency Medicine | Admitting: Emergency Medicine

## 2020-01-22 ENCOUNTER — Ambulatory Visit: Payer: 59 | Admitting: Physical Therapy

## 2020-01-22 ENCOUNTER — Telehealth: Payer: Self-pay | Admitting: Primary Care

## 2020-01-22 DIAGNOSIS — Z79899 Other long term (current) drug therapy: Secondary | ICD-10-CM | POA: Insufficient documentation

## 2020-01-22 DIAGNOSIS — Y92410 Unspecified street and highway as the place of occurrence of the external cause: Secondary | ICD-10-CM | POA: Insufficient documentation

## 2020-01-22 DIAGNOSIS — F17210 Nicotine dependence, cigarettes, uncomplicated: Secondary | ICD-10-CM | POA: Diagnosis not present

## 2020-01-22 DIAGNOSIS — S6992XA Unspecified injury of left wrist, hand and finger(s), initial encounter: Secondary | ICD-10-CM | POA: Diagnosis present

## 2020-01-22 DIAGNOSIS — Y9389 Activity, other specified: Secondary | ICD-10-CM | POA: Diagnosis not present

## 2020-01-22 DIAGNOSIS — S52515A Nondisplaced fracture of left radial styloid process, initial encounter for closed fracture: Secondary | ICD-10-CM | POA: Insufficient documentation

## 2020-01-22 DIAGNOSIS — Y999 Unspecified external cause status: Secondary | ICD-10-CM | POA: Insufficient documentation

## 2020-01-22 DIAGNOSIS — R0789 Other chest pain: Secondary | ICD-10-CM | POA: Insufficient documentation

## 2020-01-22 MED ORDER — NAPROXEN 250 MG PO TABS: 500.0000 mg | ORAL_TABLET | Freq: Two times a day (BID) | ORAL | 0 refills | Status: DC

## 2020-01-22 MED ORDER — KETOROLAC TROMETHAMINE 15 MG/ML IJ SOLN
15.0000 mg | Freq: Once | INTRAMUSCULAR | Status: AC
  Administered 2020-01-22: 15 mg via INTRAMUSCULAR
  Filled 2020-01-22: qty 1

## 2020-01-22 NOTE — BH Specialist Note (Signed)
Integrated Behavioral Health Initial Visit  MRN: [id number] Name: IZETTA SAKAMOTO  Number of Integrated Behavioral Health Clinician visits:: 1/6 Session Start time: 9:00 AM  Session End time: 9:30 AM Total time: 30  Type of Service: Integrated Behavioral Health- Individual Interpretor:No. Interpretor Name and Language: NA   SUBJECTIVE: NYRIAH COOTE is a 59 y.o. female accompanied by self Patient was referred by NP Randa Evens for depression and anxiety. Patient reports the following symptoms/concerns: Pt reports increase in depression and anxiety symptoms triggered by chronic pain, grief, and psychosocial stressors Duration of problem: Approx 21month; Severity of problem: moderate  OBJECTIVE: Mood: Anxious and Affect: Appropriate Risk of harm to self or others: No plan to harm self or others  LIFE CONTEXT: Family and Social: Pt receives limited support from partner and family School/Work: Pt has physically demanding employment which negatively impacts ability to manage chronic pain Self-Care: Pt denies substance use Life Changes: Pt has difficulty managing chronic pain and psychosocial stressors which negatively impacts physical and mental health  GOALS ADDRESSED: Patient will: 1. Increase knowledge and/or ability of: coping skills Pt agreed to utilize healthy strategies identified in session   INTERVENTIONS: Interventions utilized: Solution-Focused Strategies, Supportive Counseling and Psychoeducation and/or Health Education  Standardized Assessments completed: Not Needed  ASSESSMENT: Patient currently experiencing increase in depression and anxiety symptoms triggered by grief, chronic pain, and psychosocial stressors.   Patient may benefit from therapy and medication mangement. Healthy coping skills to assist in management and/or decrease of symptoms identified.   PLAN: 1. Follow up with behavioral health clinician on : 02/05/20 2. Behavioral recommendations: utilize  strategies discussed and follow through with upcoming appt with PCP 3. Referral(s): Integrated Behavioral Health Services (In Clinic) 4. "From scale of 1-10, how likely are you to follow plan?":   Bridgett Larsson, LCSW 01/22/2020 12:19 AM
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
## 2020-01-22 NOTE — Discharge Instructions (Signed)
Continue taking home medications as prescribed including the backs and and pain medication you are given last week. Take naproxen twice a day with meals.  Do not take other anti-inflammatories at the same time. You may supplement with Tylenol as needed for further pain control. Follow-up with a hand doctor listed below for further evaluation of your wrist. Follow-up with your primary care doctor as needed for continued chest pain. Return to the emergency room with any new, worsening, or concerning symptoms.
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
## 2020-01-22 NOTE — ED Notes (Signed)
An After Visit Summary was printed and given to the patient. Discharge instructions given and no further questions at this time.  

## 2020-01-22 NOTE — Progress Notes (Signed)
Orthopedic Tech Progress Note Patient Details:  Joanna Phelps 21-Oct-1960 [id number]  Ortho Devices Ortho Device/Splint Location: applied velcro wrist splint to LUE Ortho Device/Splint Interventions: Ordered, Application   Post Interventions Patient Tolerated: Well Instructions Provided: Care of device   [name] 01/22/2020, 10:26 PM

## 2020-01-23 NOTE — Telephone Encounter (Signed)
Left voicemail notifying patient that PCP can not provide work note for missed days. She would have needed to ask ED. Its possible that a note can be done during appointment for that day of.
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
## 2020-01-26 NOTE — ED Notes (Signed)
Opened chart at pts request to write work note 

## 2020-01-30 ENCOUNTER — Ambulatory Visit (INDEPENDENT_AMBULATORY_CARE_PROVIDER_SITE_OTHER): Payer: Self-pay
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
## 2020-01-31 ENCOUNTER — Encounter (INDEPENDENT_AMBULATORY_CARE_PROVIDER_SITE_OTHER): Payer: Self-pay | Admitting: Primary Care

## 2020-01-31 ENCOUNTER — Other Ambulatory Visit: Payer: Self-pay

## 2020-01-31 ENCOUNTER — Ambulatory Visit (INDEPENDENT_AMBULATORY_CARE_PROVIDER_SITE_OTHER): Payer: 59 | Admitting: Primary Care

## 2020-01-31 VITALS — BP 134/83 | HR 88 | Temp 97.2°F | Ht 66.0 in | Wt 201.4 lb

## 2020-01-31 DIAGNOSIS — Z09 Encounter for follow-up examination after completed treatment for conditions other than malignant neoplasm: Secondary | ICD-10-CM | POA: Diagnosis not present

## 2020-01-31 DIAGNOSIS — F4310 Post-traumatic stress disorder, unspecified: Secondary | ICD-10-CM

## 2020-01-31 DIAGNOSIS — I10 Essential (primary) hypertension: Secondary | ICD-10-CM

## 2020-01-31 DIAGNOSIS — F32A Depression, unspecified: Secondary | ICD-10-CM | POA: Diagnosis not present
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
## 2020-02-02 ENCOUNTER — Inpatient Hospital Stay (INDEPENDENT_AMBULATORY_CARE_PROVIDER_SITE_OTHER): Payer: 59 | Admitting: Primary Care

## 2020-02-05 ENCOUNTER — Telehealth: Payer: Self-pay | Admitting: Licensed Clinical Social Worker

## 2020-02-05 ENCOUNTER — Institutional Professional Consult (permissible substitution): Payer: 59 | Admitting: Licensed Clinical Social Worker

## 2020-02-05 NOTE — Telephone Encounter (Signed)
Call placed to patient regarding scheduled [facility] appointment. LCSW left message requesting a return call.  

## 2020-02-26 ENCOUNTER — Other Ambulatory Visit: Payer: Self-pay

## 2020-02-26 ENCOUNTER — Encounter (INDEPENDENT_AMBULATORY_CARE_PROVIDER_SITE_OTHER): Payer: Self-pay | Admitting: Primary Care

## 2020-02-26 ENCOUNTER — Ambulatory Visit (INDEPENDENT_AMBULATORY_CARE_PROVIDER_SITE_OTHER): Payer: 59 | Admitting: Primary Care

## 2020-02-26 VITALS — BP 124/82 | HR 64 | Temp 95.4°F | Ht 66.0 in | Wt 206.2 lb

## 2020-02-26 DIAGNOSIS — I10 Essential (primary) hypertension: Secondary | ICD-10-CM | POA: Diagnosis not present

## 2020-02-26 DIAGNOSIS — F32A Depression, unspecified: Secondary | ICD-10-CM

## 2020-02-26 DIAGNOSIS — Z1211 Encounter for screening for malignant neoplasm of colon: Secondary | ICD-10-CM | POA: Diagnosis not present

## 2020-02-26 DIAGNOSIS — M25532 Pain in left wrist: Secondary | ICD-10-CM

## 2020-02-26 DIAGNOSIS — N6002 Solitary cyst of left breast: Secondary | ICD-10-CM

## 2020-02-26 NOTE — Progress Notes (Signed)
Established Patient Office Visit  Subjective:  Patient ID: Joanna Phelps, female    DOB: [birth year]  Age: 59 y.o. MRN: [id number]  CC:  Chief Complaint  Patient presents with  . Hypertension    Medication management     HPI Joanna Phelps is a 59 year old female who  presents for medication management last visit we discussed what medication she was actually taking and bring in the bottles. She forgot and remembered after she was here. She is concern about her left wrist first time seeing MD and he was not pleased with progress and discussed surgery. Also, after mammogram she has a cyst on the right breast needs to be removed . She is over whelm with all this information at once. Blood pressure is unremarkable.Despite all these issues. Denies shortness of breath, headaches, chest pain or lower extremity edema  Past Medical History:  Diagnosis Date  . Chlamydia   . Hemorrhoids     Past Surgical History:  Procedure Laterality Date  . Boil Lanced      Family History  Problem Relation Age of Onset  . Diabetes Mother   . Hypertension Mother   . Hypertension Sister   . Diabetes Sister   . Hypertension Sister   . Hypertension Sister   . Hypertension Sister   . Hypertension Sister     Social History   Socioeconomic History  . Marital status: Single    Spouse name: Not on file  . Number of children: 0  . Years of education: Not on file  . Highest education level: 11th grade  Occupational History  . Not on file  Tobacco Use  . Smoking status: Current Every Day Smoker    Types: Cigarettes  . Smokeless tobacco: Never Used  Vaping Use  . Vaping Use: Never used  Substance and Sexual Activity  . Alcohol use: No  . Drug use: Not Currently    Types: Marijuana  . Sexual activity: Yes    Birth control/protection: None  Other Topics Concern  . Not on file  Social History Narrative  . Not on file   Social Determinants of Health   Financial Resource Strain: Not on  file  Food Insecurity: Not on file  Transportation Needs: No Transportation Needs  . Lack of Transportation (Medical): No  . Lack of Transportation (Non-Medical): No  Physical Activity: Not on file  Stress: Not on file  Social Connections: Not on file  Intimate Partner Violence: Not on file    Outpatient Medications Prior to Visit  Medication Sig Dispense Refill  . amLODipine (NORVASC) 10 MG tablet Take 1 tablet (10 mg total) by mouth daily. 90 tablet 3  . atorvastatin (LIPITOR) 20 MG tablet Take 1 tablet (20 mg total) by mouth daily. 90 tablet 3  . baclofen (LIORESAL) 10 MG tablet Take 0.5-1 tablets (5-10 mg total) by mouth at bedtime as needed for muscle spasms. 30 each 3  . buPROPion (WELLBUTRIN SR) 150 MG 12 hr tablet Take 1 tablet (150 mg total) by mouth 2 (two) times daily. 180 tablet 1  . celecoxib (CELEBREX) 200 MG capsule Take 1 capsule (200 mg total) by mouth 2 (two) times daily as needed. 60 capsule 6  . hydrochlorothiazide (HYDRODIURIL) 25 MG tablet Take 1 tablet (25 mg total) by mouth daily. 90 tablet 1  . naproxen (NAPROSYN) 250 MG tablet Take 2 tablets (500 mg total) by mouth 2 (two) times daily with a meal. 24 tablet 0  No facility-administered medications prior to visit.    Allergies  Allergen Reactions  . Ibuprofen Itching, Rash and Hives    ROS Review of Systems  Musculoskeletal:       Left wrist pain       Objective:    Physical Exam Vitals reviewed.  Constitutional:      Appearance: She is obese.  HENT:     Head: Normocephalic.     Nose: Nose normal.  Eyes:     Extraocular Movements: Extraocular movements intact.  Cardiovascular:     Rate and Rhythm: Normal rate and regular rhythm.  Pulmonary:     Effort: Pulmonary effort is normal.     Breath sounds: Normal breath sounds.  Chest:     Comments: 9 o clock palpated cyst on right breast Abdominal:     General: Bowel sounds are normal.     Palpations: Abdomen is soft.  Musculoskeletal:         General: Normal range of motion.     Cervical back: Normal range of motion.  Skin:    General: Skin is warm and dry.  Neurological:     Mental Status: She is alert and oriented to person, place, and time.     BP 124/82 (BP Location: Right Arm, Patient Position: Sitting, Cuff Size: Large)   Pulse 64   Temp (!) 95.4 F (35.2 C) (Temporal)   Ht 5\' 6"  (1.676 m)   Wt 206 lb 3.2 oz (93.5 kg)   SpO2 98%   BMI 33.28 kg/m  Wt Readings from Last 3 Encounters:  02/26/20 206 lb 3.2 oz (93.5 kg)  01/31/20 201 lb 6.4 oz (91.4 kg)  01/22/20 205 lb (93 kg)     Health Maintenance Due  Topic Date Due  . COLONOSCOPY  Never done    There are no preventive care reminders to display for this patient.  Lab Results  Component Value Date   TSH 2.588 07/16/2009   Lab Results  Component Value Date   WBC 5.3 12/21/2019   HGB 14.3 12/21/2019   HCT 43.0 12/21/2019   MCV 85 12/21/2019   PLT 259 12/21/2019   Lab Results  Component Value Date   NA 141 12/21/2019   K 4.4 12/21/2019   CO2 25 12/21/2019   GLUCOSE 87 12/21/2019   BUN 10 12/21/2019   CREATININE 0.82 12/21/2019   BILITOT <0.2 12/21/2019   ALKPHOS 75 12/21/2019   AST 12 12/21/2019   ALT 10 12/21/2019   PROT 7.0 12/21/2019   ALBUMIN 4.2 12/21/2019   CALCIUM 9.0 12/21/2019   Lab Results  Component Value Date   CHOL 178 12/21/2019   Lab Results  Component Value Date   HDL 31 (L) 12/21/2019   Lab Results  Component Value Date   LDLCALC 130 (H) 12/21/2019   Lab Results  Component Value Date   TRIG 93 12/21/2019   Lab Results  Component Value Date   CHOLHDL 5.7 (H) 12/21/2019   No results found for: HGBA1C    Assessment & Plan:  Joanna Phelps was seen today for hypertension.  Diagnoses and all orders for this visit:  Essential hypertension, benign Well control on amlodipine 10mg  and HCTZ 25mg  daily Continue following low-sodium, DASH diet, medication compliance, 150 minutes of moderate intensity exercise per  week. Discussed medication compliance, adverse effects.  Left wrist pain Followed by orthopedics   Depression, unspecified depression type Overwhelm with medical problems recently started on Wellbutrin only taking 4 weeks will follow up  on effectiveness.   Benign breast cyst in female, left 9 o clock palpated cyst on right breast-recommended removal of cyst schedule on 03/12/20  Colon cancer screening Normal colon cancer screening.  CDC recommends colorectal screening from ages 40-75.  Refer to GI   Follow-up: Return in about 3 months (around 05/26/2020) for BP/ depression.    Grayce Sessions, NP

## 2020-04-17 ENCOUNTER — Other Ambulatory Visit: Payer: Self-pay

## 2020-04-17 ENCOUNTER — Encounter (INDEPENDENT_AMBULATORY_CARE_PROVIDER_SITE_OTHER): Payer: Self-pay | Admitting: Primary Care

## 2020-04-17 ENCOUNTER — Ambulatory Visit (INDEPENDENT_AMBULATORY_CARE_PROVIDER_SITE_OTHER): Payer: 59 | Admitting: Primary Care

## 2020-04-17 DIAGNOSIS — G8929 Other chronic pain: Secondary | ICD-10-CM | POA: Diagnosis not present

## 2020-04-17 DIAGNOSIS — M545 Low back pain, unspecified: Secondary | ICD-10-CM
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
## 2020-04-17 NOTE — Progress Notes (Signed)
C/o cont'd back pain  Still has pain after therapy Pain is radiating down both legs and she is unable to sleep at night. This pain radiates to groin area.   7/10 pain is intermittent. Worse at night.

## 2020-04-29 ENCOUNTER — Other Ambulatory Visit: Payer: Self-pay | Admitting: Neurological Surgery

## 2020-04-29 ENCOUNTER — Other Ambulatory Visit: Payer: 59

## 2020-04-29 DIAGNOSIS — M48062 Spinal stenosis, lumbar region with neurogenic claudication: Secondary | ICD-10-CM

## 2020-05-11 ENCOUNTER — Other Ambulatory Visit: Payer: Self-pay

## 2020-05-11 ENCOUNTER — Ambulatory Visit
Admission: RE | Admit: 2020-05-11 | Discharge: 2020-05-11 | Disposition: A | Discharge status: Home / Self Care | Payer: 59 | Source: Ambulatory Visit | Attending: Neurological Surgery | Admitting: Neurological Surgery

## 2020-05-11 DIAGNOSIS — M48062 Spinal stenosis, lumbar region with neurogenic claudication: Secondary | ICD-10-CM

## 2020-05-27 ENCOUNTER — Other Ambulatory Visit: Payer: Self-pay

## 2020-05-27 ENCOUNTER — Ambulatory Visit (INDEPENDENT_AMBULATORY_CARE_PROVIDER_SITE_OTHER): Payer: 59 | Admitting: Primary Care

## 2020-05-27 ENCOUNTER — Encounter (INDEPENDENT_AMBULATORY_CARE_PROVIDER_SITE_OTHER): Payer: Self-pay | Admitting: Primary Care

## 2020-05-27 VITALS — BP 117/81 | HR 67 | Temp 97.3°F | Ht 66.0 in | Wt 204.4 lb

## 2020-05-27 DIAGNOSIS — I10 Essential (primary) hypertension: Secondary | ICD-10-CM

## 2020-05-27 DIAGNOSIS — F32A Depression, unspecified: Secondary | ICD-10-CM | POA: Diagnosis not present

## 2020-05-27 DIAGNOSIS — E785 Hyperlipidemia, unspecified: Secondary | ICD-10-CM | POA: Diagnosis not present

## 2020-05-27 DIAGNOSIS — Z1211 Encounter for screening for malignant neoplasm of colon: Secondary | ICD-10-CM

## 2020-05-27 DIAGNOSIS — M48062 Spinal stenosis, lumbar region with neurogenic claudication: Secondary | ICD-10-CM | POA: Diagnosis not present

## 2020-05-27 DIAGNOSIS — Z720 Tobacco use: Secondary | ICD-10-CM

## 2020-05-27 MED ORDER — AMLODIPINE BESYLATE 10 MG PO TABS: 10.0000 mg | ORAL_TABLET | Freq: Every day | ORAL | 3 refills | Status: DC

## 2020-05-27 MED ORDER — BUPROPION HCL ER (SR) 150 MG PO TB12: 150.0000 mg | ORAL_TABLET | Freq: Two times a day (BID) | ORAL | 1 refills | Status: AC

## 2020-05-27 MED ORDER — ATORVASTATIN CALCIUM 20 MG PO TABS: 20.0000 mg | ORAL_TABLET | Freq: Every day | ORAL | 3 refills | Status: DC

## 2020-05-27 MED ORDER — HYDROCHLOROTHIAZIDE 25 MG PO TABS: 25.0000 mg | ORAL_TABLET | Freq: Every day | ORAL | 1 refills | Status: DC

## 2020-05-27 NOTE — Progress Notes (Signed)
[facility]    [name]. Streat is a 60 year old female who presents for  hypertension evaluation on amlodipine 10mg  and HCTZ 25mg  . Bp unremarkable . Denies shortness of breath, headaches, chest pain or lower extremity edema, sudden onset, vision changes, unilateral weakness, dizziness, paresthesias  Patient reports adherence with medications.  Current Medication List Current Outpatient Medications on File Prior to Visit  Medication Sig Dispense Refill  . amLODipine (NORVASC) 10 MG tablet Take 1 tablet (10 mg total) by mouth daily. 90 tablet 3  . atorvastatin (LIPITOR) 20 MG tablet Take 1 tablet (20 mg total) by mouth daily. 90 tablet 3  . baclofen (LIORESAL) 10 MG tablet Take 0.5-1 tablets (5-10 mg total) by mouth at bedtime as needed for muscle spasms. 30 each 3  . buPROPion (WELLBUTRIN SR) 150 MG 12 hr tablet Take 1 tablet (150 mg total) by mouth 2 (two) times daily. 180 tablet 1  . celecoxib (CELEBREX) 200 MG capsule Take 1 capsule (200 mg total) by mouth 2 (two) times daily as needed. 60 capsule 6  . hydrochlorothiazide (HYDRODIURIL) 25 MG tablet Take 1 tablet (25 mg total) by mouth daily. 90 tablet 1   No current facility-administered medications on file prior to visit.   Past Medical History  Past Medical History:  Diagnosis Date  . Chlamydia   . Hemorrhoids    Dietary habits include: monitor sodium intake  Exercise habits include walking  Family / Social history: No  ASCVD risk factors include-  O:  Physical Exam Vitals reviewed.  Constitutional:      Appearance: She is obese.  HENT:     Head: Normocephalic.     Right Ear: External ear normal.     Left Ear: External ear normal.     Nose: Nose normal.  Eyes:     Extraocular Movements: Extraocular movements intact.  Cardiovascular:     Rate and Rhythm: Normal rate and regular rhythm.  Pulmonary:     Effort: Pulmonary effort is normal.     Breath sounds: Normal breath sounds.  Abdominal:      General: Bowel sounds are normal. There is distension.     Palpations: Abdomen is soft.  Musculoskeletal:        General: Normal range of motion.     Cervical back: Normal range of motion and neck supple.  Skin:    General: Skin is warm and dry.  Neurological:     Mental Status: She is alert and oriented to person, place, and time.  Psychiatric:        Mood and Affect: Mood normal.        Behavior: Behavior normal.        Thought Content: Thought content normal.        Judgment: Judgment normal.    ROS Pertinent positive and negative noted   Last 3 Office BP readings: BP Readings from Last 3 Encounters:  05/27/20 117/81  04/17/20 131/84  02/26/20 124/82    BMET    Component Value Date/Time   NA 141 12/21/2019 1147   K 4.4 12/21/2019 1147   CL 104 12/21/2019 1147   CO2 25 12/21/2019 1147   GLUCOSE 87 12/21/2019 1147   GLUCOSE 148 (H) 03/09/2011 0327   BUN 10 12/21/2019 1147   CREATININE 0.82 12/21/2019 1147   CALCIUM 9.0 12/21/2019 1147   GFRNONAA 80 12/21/2019 1147   GFRAA 92 12/21/2019 1147    Renal function: CrCl cannot be calculated (Patient's  most recent lab result is older than the maximum 21 days allowed.).  Clinical ASCVD: Yes  The 10-year ASCVD risk score Denman George DC Jr., et al., 2013) is: 11.7%   Values used to calculate the score:     Age: 27 years     Sex: Female     Is Non-Hispanic African American: Yes     Diabetic: No     Tobacco smoker: Yes     Systolic Blood Pressure: 117 mmHg     Is BP treated: Yes     HDL Cholesterol: 31 mg/dL     Total Cholesterol: 178 mg/dL   A/P: Essential hypertension, benign Hypertension longstanding/newly diagnosed currently amlodipine 10mg  and HCTZ 25mg  on current medications. BP Goal = 130/80 mmHg. Patient is adherent with current medications. Well controlled < 130/80 . -Continued  -F/u labs ordered - lipid  -Counseled on lifestyle modifications for blood pressure control including reduced dietary sodium,  increased exercise, adequate sleep  Depression, unspecified depression type Wellbutrin XR  Does not feel it is helping her depression but is definitely helping with cigarettes   Hyperlipidemia, unspecified hyperlipidemia type Currently on atrovastatin 20mg  at bedtime. Continue to  decrease your fatty foods, red meat, cheese, milk and increase fiber like whole grains and veggies.  Lipid panel today   Spinal stenosis, lumbar region, with neurogenic claudication Lower back pain radiates to buttock down her leg left one only pain 10/10 with getting up and down, bending and laying down. Ambulation helps with the pain  Followed by orthopedics first appt tomorrow [facility]   Colon cancer screening refer to GI  Tobacco abuse Wellbutrin has decrease smoking cessation each visit discuss cessation. She is aware of risk factors associated with smoking each visit discuss cessation   

## 2020-05-28 LAB — LIPID PANEL
Chol/HDL Ratio: 6 ratio — ABNORMAL HIGH (ref 0.0–4.4)
Cholesterol, Total: 205 mg/dL — ABNORMAL HIGH (ref 100–199)
HDL: 34 mg/dL — ABNORMAL LOW (ref >39)
LDL Chol Calc (NIH): 153 mg/dL — ABNORMAL HIGH (ref 0–99)
Triglycerides: 97 mg/dL (ref 0–149)
VLDL Cholesterol Cal: 18 mg/dL (ref 5–40)

## 2020-12-09 ENCOUNTER — Other Ambulatory Visit: Payer: Self-pay

## 2020-12-09 ENCOUNTER — Ambulatory Visit: Payer: 59 | Admitting: Physician Assistant

## 2020-12-09 VITALS — BP 140/86 | HR 72 | Temp 98.3°F | Resp 18 | Ht 66.0 in | Wt 197.0 lb

## 2020-12-09 DIAGNOSIS — H109 Unspecified conjunctivitis: Secondary | ICD-10-CM | POA: Diagnosis not present

## 2020-12-09 MED ORDER — POLYMYXIN B-TRIMETHOPRIM 10000-0.1 UNIT/ML-% OP SOLN: 1.0000 [drp] | Freq: Four times a day (QID) | OPHTHALMIC | 0 refills | Status: AC

## 2020-12-09 NOTE — Patient Instructions (Signed)
You are going to use prescription antibiotic eyedrops in both eyes 4 times a day for the next 7 days.  I do encourage you to continue using either Zyrtec or Claritin on a daily basis, and use warm moist compresses as needed for relief.  Please let us know if there is anything else we can do for you.  Roney Jaffe, PA-C Physician Assistant [facility] [email or web address]  Bacterial Conjunctivitis, Adult Bacterial conjunctivitis is an infection of the clear membrane that covers the white part of the eye and the inner surface of the eyelid (conjunctiva). When the blood vessels in the conjunctiva become inflamed, the eye becomes red or pink. The eye often feels irritated or itchy. Bacterial conjunctivitis spreads easily from person to person (is contagious). It also spreads easily from one eye to the other eye. What are the causes? This condition is caused by bacteria. You may get the infection if you come into close contact with: A person who is infected with the bacteria. Items that are contaminated with the bacteria, such as a face towel, contact lens solution, or eye makeup. What increases the risk? You are more likely to develop this condition if: You are exposed to other people who have the infection. You wear contact lenses. You have a sinus infection. You have had a recent eye injury or surgery. You have a weak body defense system (immune system). You have a medical condition that causes dry eyes. What are the signs or symptoms? Symptoms of this condition include: Thick, yellowish discharge from the eye. This may turn into a crust on the eyelid overnight and cause your eyelids to stick together. Tearing or watery eyes. Itchy eyes. Burning feeling in your eyes. Eye redness. Swollen eyelids. Blurred vision. How is this diagnosed? This condition is diagnosed based on your symptoms and medical history. Your health care provider  may also take a sample of discharge from your eye to find the cause of your infection. How is this treated? This condition may be treated with: Antibiotic eye drops or ointment to clear the infection more quickly and prevent the spread of infection to others. Antibiotic medicines taken by mouth (orally) to treat infections that do not respond to drops or ointments or that last longer than 10 days. Cool, wet cloths (cool compresses) placed on the eyes. Artificial tears applied 2-6 times a day. Follow these instructions at home: Medicines Take or apply your antibiotic medicine as told by your health care provider. Do not stop using the antibiotic, even if your condition improves, unless directed by your health care provider. Take or apply over-the-counter and prescription medicines only as told by your health care provider. Be very careful to avoid touching the edge of your eyelid with the eye-drop bottle or the ointment tube when you apply medicines to the affected eye. This will keep you from spreading the infection to your other eye or to other people. Managing discomfort Gently wipe away any drainage from your eye with a warm, wet washcloth or a cotton ball. Apply a clean, cool compress to your eye for 10-20 minutes, 3-4 times a day. General instructions Do not wear contact lenses until the inflammation is gone and your health care provider says it is safe to wear them again. Ask your health care provider how to sterilize or replace your contact lenses before you use them again. Wear glasses until you can resume wearing contact lenses. Avoid wearing eye makeup until the inflammation is gone. Throw away  any old eye cosmetics that may be contaminated. Change or wash your pillowcase every day. Do not share towels or washcloths. This may spread the infection. Wash your hands often with soap and water for at least 20 seconds and especially before touching your face or eyes. Use paper towels to dry  your hands. Avoid touching or rubbing your eyes. Do not drive or use heavy machinery if your vision is blurred. Contact a health care provider if: You have a fever. Your symptoms do not get better after 10 days. Get help right away if: You have a fever and your symptoms suddenly get worse. You have severe pain when you move your eye. You have facial pain, redness, or swelling. You have a sudden loss of vision. Summary Bacterial conjunctivitis is an infection of the clear membrane that covers the white part of the eye and the inner surface of the eyelid (conjunctiva). Bacterial conjunctivitis spreads easily from eye to eye and from person to person (is contagious). Wash your hands often with soap and water for at least 20 seconds and especially before touching your face or eyes. Use paper towels to dry your hands. Take or apply your antibiotic medicine as told by your health care provider. Do not stop using the antibiotic even if your condition improves. Contact a health care provider if you have a fever or if your symptoms do not get better after 10 days. Get help right away if you have a sudden loss of vision. This information is not intended to replace advice given to you by your health care provider. Make sure you discuss any questions you have with your health care provider. Document Revised: 06/12/2020 Document Reviewed: 06/12/2020 Elsevier Patient Education  2022 [employer].

## 2020-12-09 NOTE — Progress Notes (Signed)
Patient reports right eye irritation for the past 2 weeks. Patient reports drainage increased at night with clear fluids. Patient reports no Hx and has been taking OTC benadryl, allegra, Claritin and zyrtec with no relief. Patient has eaten today and patient has not used any medication, last taken BP medication yesterday. Patient reports wearing glasses and receiving and eye exam and new glasses this year with no report of glaucoma/cataracts.

## 2020-12-09 NOTE — Progress Notes (Signed)
Established Patient Office Visit  Subjective:  Patient ID: Joanna Phelps, female    DOB: 1961-02-10  Age: 60 y.o. MRN: [id number]  CC:  Chief Complaint  Patient presents with   Eye Drainage     HPI Joanna Phelps reports that she has been experiencing irritation and clear / cloudy drainage from her right eye for the past 2 weeks.  States that she will have matting in the morning, states that it has now started to move into her left eye.  Reports that she has been taking Benadryl, and her daily allergy medication without relief.  Denies sick contacts, denies any other URI symptoms.   Past Medical History:  Diagnosis Date   Chlamydia    Hemorrhoids     Past Surgical History:  Procedure Laterality Date   Boil Lanced      Family History  Problem Relation Age of Onset   Diabetes Mother    Hypertension Mother    Hypertension Sister    Diabetes Sister    Hypertension Sister    Hypertension Sister    Hypertension Sister    Hypertension Sister     Social History   Socioeconomic History   Marital status: Single    Spouse name: Not on file   Number of children: 0   Years of education: Not on file   Highest education level: 11th grade  Occupational History   Not on file  Tobacco Use   Smoking status: Every Day    Types: Cigarettes   Smokeless tobacco: Never   Tobacco comments:    1 cig a week  Vaping Use   Vaping Use: Never used  Substance and Sexual Activity   Alcohol use: No   Drug use: Not Currently    Types: Marijuana   Sexual activity: Yes    Birth control/protection: None  Other Topics Concern   Not on file  Social History Narrative   Not on file   Social Determinants of Health   Financial Resource Strain: Not on file  Food Insecurity: Not on file  Transportation Needs: Not on file  Physical Activity: Not on file  Stress: Not on file  Social Connections: Not on file  Intimate Partner Violence: Not on file    Outpatient Medications Prior to  Visit  Medication Sig Dispense Refill   amLODipine (NORVASC) 10 MG tablet Take 1 tablet (10 mg total) by mouth daily. 90 tablet 3   atorvastatin (LIPITOR) 20 MG tablet Take 1 tablet (20 mg total) by mouth daily. 90 tablet 3   baclofen (LIORESAL) 10 MG tablet Take 0.5-1 tablets (5-10 mg total) by mouth at bedtime as needed for muscle spasms. 30 each 3   hydrochlorothiazide (HYDRODIURIL) 25 MG tablet Take 1 tablet (25 mg total) by mouth daily. 90 tablet 1   buPROPion (WELLBUTRIN SR) 150 MG 12 hr tablet Take 1 tablet (150 mg total) by mouth 2 (two) times daily. (Patient not taking: Reported on 12/09/2020) 180 tablet 1   celecoxib (CELEBREX) 200 MG capsule Take 1 capsule (200 mg total) by mouth 2 (two) times daily as needed. (Patient not taking: Reported on 12/09/2020) 60 capsule 6   No facility-administered medications prior to visit.    Allergies  Allergen Reactions   Ibuprofen Itching, Rash and Hives    ROS Review of Systems  Constitutional:  Negative for chills and fever.  HENT:  Negative for ear pain, sinus pressure and sore throat.   Eyes:  Positive for discharge, redness  and itching. Negative for pain and visual disturbance.  Respiratory:  Negative for shortness of breath.   Cardiovascular:  Negative for chest pain.  Gastrointestinal: Negative.   Endocrine: Negative.   Genitourinary: Negative.   Musculoskeletal: Negative.   Skin: Negative.   Allergic/Immunologic: Negative.   Neurological: Negative.   Hematological: Negative.   Psychiatric/Behavioral: Negative.       Objective:    Physical Exam Vitals and nursing note reviewed.  Constitutional:      Appearance: Normal appearance.  HENT:     Right Ear: Tympanic membrane, ear canal and external ear normal.     Left Ear: Tympanic membrane, ear canal and external ear normal.     Nose: Nose normal.     Mouth/Throat:     Mouth: Mucous membranes are dry.     Pharynx: Oropharynx is clear.  Eyes:     General: Lids are normal.         Right eye: Discharge present. No foreign body or hordeolum.        Left eye: Discharge present.No foreign body or hordeolum.     Extraocular Movements:     Right eye: Normal extraocular motion.     Left eye: Normal extraocular motion.     Conjunctiva/sclera:     Right eye: Right conjunctiva is injected. Exudate present.     Left eye: Left conjunctiva is injected. No exudate. Cardiovascular:     Rate and Rhythm: Normal rate and regular rhythm.     Pulses: Normal pulses.     Heart sounds: Normal heart sounds.  Pulmonary:     Effort: Pulmonary effort is normal.     Breath sounds: Normal breath sounds.  Musculoskeletal:        General: Normal range of motion.     Cervical back: Normal range of motion and neck supple.  Skin:    General: Skin is warm and dry.  Neurological:     Mental Status: She is alert and oriented to person, place, and time.  Psychiatric:        Mood and Affect: Mood normal.        Behavior: Behavior normal.        Thought Content: Thought content normal.        Judgment: Judgment normal.    BP 140/86 (BP Location: Left Arm, Patient Position: Sitting, Cuff Size: Normal)   Pulse 72   Temp 98.3 F (36.8 C) (Oral)   Resp 18   Ht 5\' 6"  (1.676 m)   Wt 197 lb (89.4 kg)   SpO2 99%   BMI 31.80 kg/m  Wt Readings from Last 3 Encounters:  12/09/20 197 lb (89.4 kg)  05/27/20 204 lb 6.4 oz (92.7 kg)  04/17/20 202 lb (91.6 kg)     Health Maintenance Due  Topic Date Due   TETANUS/TDAP  Never done   Zoster Vaccines- Shingrix (1 of 2) Never done   COLONOSCOPY (Pts 45-82yrs Insurance coverage will need to be confirmed)  Never done   COVID-19 Vaccine (4 - Booster for Pfizer series) 05/13/2020   INFLUENZA VACCINE  10/14/2020    There are no preventive care reminders to display for this patient.  Lab Results  Component Value Date   TSH 2.588 07/16/2009   Lab Results  Component Value Date   WBC 5.3 12/21/2019   HGB 14.3 12/21/2019   HCT 43.0 12/21/2019    MCV 85 12/21/2019   PLT 259 12/21/2019   Lab Results  Component Value Date  NA 141 12/21/2019   K 4.4 12/21/2019   CO2 25 12/21/2019   GLUCOSE 87 12/21/2019   BUN 10 12/21/2019   CREATININE 0.82 12/21/2019   BILITOT <0.2 12/21/2019   ALKPHOS 75 12/21/2019   AST 12 12/21/2019   ALT 10 12/21/2019   PROT 7.0 12/21/2019   ALBUMIN 4.2 12/21/2019   CALCIUM 9.0 12/21/2019   Lab Results  Component Value Date   CHOL 205 (H) 05/27/2020   Lab Results  Component Value Date   HDL 34 (L) 05/27/2020   Lab Results  Component Value Date   LDLCALC 153 (H) 05/27/2020   Lab Results  Component Value Date   TRIG 97 05/27/2020   Lab Results  Component Value Date   CHOLHDL 6.0 (H) 05/27/2020   No results found for: HGBA1C    Assessment & Plan:   Problem List Items Addressed This Visit   None Visit Diagnoses     Bacterial conjunctivitis of left eye    -  Primary   Relevant Medications   trimethoprim-polymyxin b (POLYTRIM) ophthalmic solution       Meds ordered this encounter  Medications   trimethoprim-polymyxin b (POLYTRIM) ophthalmic solution    Sig: Place 1 drop into both eyes every 6 (six) hours for 7 days.    Dispense:  10 mL    Refill:  0    Order Specific Question:   Supervising Provider    Answer:   WRIGHT, PATRICK E [[id number]]  1. Bacterial conjunctivitis of left eye Trial Polytrim, patient education given on supportive care, red flags for prompt reevaluation. - trimethoprim-polymyxin b (POLYTRIM) ophthalmic solution; Place 1 drop into both eyes every 6 (six) hours for 7 days.  Dispense: 10 mL; Refill: 0   I have reviewed the patient's medical history (PMH, PSH, Social History, Family History, Medications, and allergies) , and have been updated if relevant. I spent 20 minutes reviewing chart and  face to face time with patient.    Follow-up: Return if symptoms worsen or fail to improve.    Kasandra Knudsen Mayers, PA-C

## 2021-02-04 ENCOUNTER — Other Ambulatory Visit: Payer: Self-pay

## 2021-02-04 ENCOUNTER — Encounter (HOSPITAL_COMMUNITY): Payer: Self-pay

## 2021-02-04 ENCOUNTER — Ambulatory Visit (INDEPENDENT_AMBULATORY_CARE_PROVIDER_SITE_OTHER): Payer: Self-pay | Admitting: *Deleted

## 2021-02-04 ENCOUNTER — Emergency Department (HOSPITAL_COMMUNITY)
Admission: EM | Admit: 2021-02-04 | Discharge: 2021-02-04 | Disposition: A | Discharge status: Home / Self Care | Payer: 59 | Attending: Emergency Medicine | Admitting: Emergency Medicine

## 2021-02-04 ENCOUNTER — Emergency Department (HOSPITAL_COMMUNITY): Payer: 59

## 2021-02-04 DIAGNOSIS — Z79899 Other long term (current) drug therapy: Secondary | ICD-10-CM | POA: Diagnosis not present

## 2021-02-04 DIAGNOSIS — F17210 Nicotine dependence, cigarettes, uncomplicated: Secondary | ICD-10-CM | POA: Insufficient documentation

## 2021-02-04 DIAGNOSIS — K429 Umbilical hernia without obstruction or gangrene: Secondary | ICD-10-CM | POA: Diagnosis not present

## 2021-02-04 DIAGNOSIS — K610 Anal abscess: Secondary | ICD-10-CM | POA: Insufficient documentation

## 2021-02-04 DIAGNOSIS — K760 Fatty (change of) liver, not elsewhere classified: Secondary | ICD-10-CM | POA: Insufficient documentation

## 2021-02-04 DIAGNOSIS — K5730 Diverticulosis of large intestine without perforation or abscess without bleeding: Secondary | ICD-10-CM | POA: Diagnosis not present

## 2021-02-04 DIAGNOSIS — K6289 Other specified diseases of anus and rectum: Secondary | ICD-10-CM | POA: Diagnosis present

## 2021-02-04 LAB — CBC WITH DIFFERENTIAL/PLATELET
Abs Immature Granulocytes: 0 10*3/uL (ref 0.00–0.07)
Basophils Absolute: 0.1 10*3/uL (ref 0.0–0.1)
Basophils Relative: 1 %
Eosinophils Absolute: 0.1 10*3/uL (ref 0.0–0.5)
Eosinophils Relative: 2 %
HCT: 42.1 % (ref 36.0–46.0)
Hemoglobin: 13.9 g/dL (ref 12.0–15.0)
Immature Granulocytes: 0 %
Lymphocytes Relative: 44 %
Lymphs Abs: 2.7 10*3/uL (ref 0.7–4.0)
MCH: 28.4 pg (ref 26.0–34.0)
MCHC: 33 g/dL (ref 30.0–36.0)
MCV: 86.1 fL (ref 80.0–100.0)
Monocytes Absolute: 0.5 10*3/uL (ref 0.1–1.0)
Monocytes Relative: 9 %
Neutro Abs: 2.7 10*3/uL (ref 1.7–7.7)
Neutrophils Relative %: 44 %
Platelets: 266 10*3/uL (ref 150–400)
RBC: 4.89 MIL/uL (ref 3.87–5.11)
RDW: 14.6 % (ref 11.5–15.5)
WBC: 6 10*3/uL (ref 4.0–10.5)
nRBC: 0 % (ref 0.0–0.2)

## 2021-02-04 LAB — BASIC METABOLIC PANEL
Anion gap: 6 (ref 5–15)
BUN: 13 mg/dL (ref 6–20)
CO2: 23 mmol/L (ref 22–32)
Calcium: 8.6 mg/dL — ABNORMAL LOW (ref 8.9–10.3)
Chloride: 109 mmol/L (ref 98–111)
Creatinine, Ser: 0.9 mg/dL (ref 0.44–1.00)
GFR, Estimated: >60 mL/min (ref >60)
Glucose, Bld: 116 mg/dL — ABNORMAL HIGH (ref 70–99)
Potassium: 3.8 mmol/L (ref 3.5–5.1)
Sodium: 138 mmol/L (ref 135–145)

## 2021-02-04 MED ORDER — HYDROCODONE-ACETAMINOPHEN 5-325 MG PO TABS
1.0000 | ORAL_TABLET | Freq: Once | ORAL | Status: AC
  Administered 2021-02-04: 1 via ORAL
  Filled 2021-02-04: qty 1

## 2021-02-04 MED ORDER — IOHEXOL 350 MG/ML SOLN
80.0000 mL | Freq: Once | INTRAVENOUS | Status: AC | PRN
  Administered 2021-02-04: 80 mL via INTRAVENOUS

## 2021-02-04 MED ORDER — LIDOCAINE HCL (PF) 1 % IJ SOLN
5.0000 mL | Freq: Once | INTRAMUSCULAR | Status: AC
  Administered 2021-02-04: 5 mL via INTRADERMAL
  Filled 2021-02-04: qty 30

## 2021-02-04 NOTE — ED Triage Notes (Signed)
Pt states she is currently experiencing both hemorrhoids and an abscess to her rectum.

## 2021-02-04 NOTE — ED Provider Notes (Signed)
Emergency Medicine Provider Triage Evaluation Note  KARUNA BALDUCCI , a 60 y.o. female  was evaluated in triage.  Pt complains of rectal pain.  Does have a history of perirectal abscess and hemorrhoids.  She did have a perirectal abscess back in February 2021 and had subsequent drainage.  This feels similar to that time.  She denies any fever or chills.  Review of Systems  Positive:  Negative: See above   Physical Exam  BP (!) 150/90 (BP Location: Left Arm)   Pulse 76   Temp 98.8 F (37.1 C) (Oral)   Resp 18   SpO2 98%  Gen:   Awake, no distress   Resp:  Normal effort  MSK:   Moves extremities without difficulty  Other:  Approximately 8 cm large area of fluctuance over the perirectal area approximately at the 6 o'clock position.  Medical Decision Making  Medically screening exam initiated at 4:49 PM.  Appropriate orders placed.  MALEEAH CROSSMAN was informed that the remainder of the evaluation will be completed by another provider, this initial triage assessment does not replace that evaluation, and the importance of remaining in the ED until their evaluation is complete.  Chaperone present.   Honor Loh Agra, PA-C 02/04/21 1653    Mancel Bale, MD 02/05/21 1332

## 2021-02-04 NOTE — ED Notes (Signed)
Bacitracin ointment and guaze placed over abcess

## 2021-02-04 NOTE — Telephone Encounter (Signed)
Pt reports  rectal abscess." States always happens when I have a hemorrhoid. States had to have abscess excised 04/2019. States has been doing sitz baths every 3 hours or more often. "I had hoped it would start draining but it hasn't." States size of nickel, no bleeding. Onset 3-4 days ago. Reports 10/10 pain. Subjective fever, "Hot at times, sweating at night." CAn't sit down, can hardly walk." Reports same area as last time when excised. Advised ED. Pt states will follow disposition. Care advise given. Pt verbalizes understanding.         Reason for Disposition  SEVERE rectal pain (e.g., excruciating, unable to have a bowel movement)  Answer Assessment - Initial Assessment Questions 1. SYMPTOM:  "What's the main symptom you're concerned about?" (e.g., pain, itching, swelling, rash)     Hemorrhoid 2. ONSET: "When did the *No Answer*  start?"     Saturday 3. RECTAL PAIN: "Do you have any pain around your rectum?" "How bad is the pain?"  (Scale 0-10; or mild, moderate, severe)   - NONE (0): no pain   - MILD (1-3): doesn't interfere with normal activities    - MODERATE (4-7): interferes with normal activities or awakens from sleep, limping    - SEVERE (8-10): excruciating pain, unable to have a bowel movement      10/10 4. RECTAL ITCHING: "Do you have any itching in this area?" "How bad is the itching?"  (Scale 0-10; or mild, moderate, severe)   - NONE: no itching   - MILD: doesn't interfere with normal activities    - MODERATE-SEVERE: interferes with normal activities or awakens from sleep     Pain now 5. CONSTIPATION: "Do you have constipation?" If Yes, ask: "How bad is it?"     BM today 6. CAUSE: "What do you think is causing the anus symptoms?"     Abscess. 7. OTHER SYMPTOMS: "Do you have any other symptoms?"  (e.g., abdomen pain, fever, rectal bleeding, vomiting)     Abscess size of nickel, may have fever, sweating  Protocols used: Rectal Symptoms-A-AH

## 2021-02-04 NOTE — ED Provider Notes (Signed)
[facility] DEPT Provider Note   CSN: [id number] Arrival date & time: 02/04/21  1625     History Chief Complaint  Patient presents with   Hemorrhoids   Abscess    TARLEE ZARCO is a 60 y.o. female with PMH multiple previous perianal abscesses, hemorrhoids who presents emergency department for evaluation of rectal pain and swelling.  Patient states that her symptoms have worsened over the last 48 hours and she is having difficulty walking due to the pain.  She denies fever, chest pain, shortness of breath, abdominal pain, nausea, vomiting or bloody diarrhea.   Abscess Associated symptoms: no fever and no vomiting       Past Medical History:  Diagnosis Date   Chlamydia    Hemorrhoids     There are no problems to display for this patient.   Past Surgical History:  Procedure Laterality Date   Boil Lanced       OB History     Gravida  0   Para      Term      Preterm      AB      Living  0      SAB      IAB      Ectopic      Multiple      Live Births              Family History  Problem Relation Age of Onset   Diabetes Mother    Hypertension Mother    Hypertension Sister    Diabetes Sister    Hypertension Sister    Hypertension Sister    Hypertension Sister    Hypertension Sister     Social History   Tobacco Use   Smoking status: Every Day    Types: Cigarettes   Smokeless tobacco: Never   Tobacco comments:    1 cig a week  Vaping Use   Vaping Use: Never used  Substance Use Topics   Alcohol use: No   Drug use: Not Currently    Types: Marijuana    Home Medications Prior to Admission medications   Medication Sig Start Date End Date Taking? Authorizing Provider  amLODipine (NORVASC) 10 MG tablet Take 1 tablet (10 mg total) by mouth daily. 05/27/20  Yes Kerin Perna, NP  atorvastatin (LIPITOR) 20 MG tablet Take 1 tablet (20 mg total) by mouth daily. 05/27/20  Yes Kerin Perna, NP   diphenhydramine-acetaminophen (TYLENOL PM) 25-500 MG TABS tablet Take 1 tablet by mouth at bedtime as needed (sleep).   Yes [provider]  hydrochlorothiazide (HYDRODIURIL) 25 MG tablet Take 1 tablet (25 mg total) by mouth daily. 05/27/20  Yes Kerin Perna, NP  baclofen (LIORESAL) 10 MG tablet Take 0.5-1 tablets (5-10 mg total) by mouth at bedtime as needed for muscle spasms. Patient not taking: Reported on 02/04/2021 01/08/20   Hilts, Legrand Como, MD  buPROPion [facility] SR) 150 MG 12 hr tablet Take 1 tablet (150 mg total) by mouth 2 (two) times daily. Patient not taking: Reported on 12/09/2020 05/27/20   Kerin Perna, NP  celecoxib (CELEBREX) 200 MG capsule Take 1 capsule (200 mg total) by mouth 2 (two) times daily as needed. Patient not taking: Reported on 12/09/2020 01/08/20   Hilts, Legrand Como, MD    Allergies    Ibuprofen  Review of Systems   Review of Systems  Constitutional:  Negative for chills and fever.  HENT:  Negative for ear  pain and sore throat.   Eyes:  Negative for pain and visual disturbance.  Respiratory:  Negative for cough and shortness of breath.   Cardiovascular:  Negative for chest pain and palpitations.  Gastrointestinal:  Positive for rectal pain. Negative for abdominal pain and vomiting.  Genitourinary:  Negative for dysuria and hematuria.  Musculoskeletal:  Negative for arthralgias and back pain.  Skin:  Negative for color change and rash.  Neurological:  Negative for seizures and syncope.  All other systems reviewed and are negative.  Physical Exam Updated Vital Signs BP (!) 137/91 (BP Location: Left Arm)   Pulse 66   Temp 98.3 F (36.8 C) (Oral)   Resp 18   SpO2 99%   Physical Exam Vitals and nursing note reviewed.  Constitutional:      General: She is not in acute distress.    Appearance: She is well-developed.  HENT:     Head: Normocephalic and atraumatic.  Eyes:     Conjunctiva/sclera: Conjunctivae normal.   Cardiovascular:     Rate and Rhythm: Normal rate and regular rhythm.     Heart sounds: No murmur heard. Pulmonary:     Effort: Pulmonary effort is normal. No respiratory distress.     Breath sounds: Normal breath sounds.  Abdominal:     Palpations: Abdomen is soft.     Tenderness: There is no abdominal tenderness.  Genitourinary:    Comments: 2 centimeter right sided perianal abscess Musculoskeletal:        General: No swelling.     Cervical back: Neck supple.  Skin:    General: Skin is warm and dry.     Capillary Refill: Capillary refill takes less than 2 seconds.  Neurological:     Mental Status: She is alert.  Psychiatric:        Mood and Affect: Mood normal.    ED Results / Procedures / Treatments   Labs (all labs ordered are listed, but only abnormal results are displayed) Labs Reviewed  BASIC METABOLIC PANEL - Abnormal; Notable for the following components:      Result Value   Glucose, Bld 116 (*)    Calcium 8.6 (*)    All other components within normal limits  CBC WITH DIFFERENTIAL/PLATELET    EKG None  Radiology CT ABDOMEN PELVIS W CONTRAST  Result Date: 02/04/2021 CLINICAL DATA:  Intrabdominal abscess rectal abscess Rectal pain.  History of perirectal abscess. EXAM: CT ABDOMEN AND PELVIS WITH CONTRAST TECHNIQUE: Multidetector CT imaging of the abdomen and pelvis was performed using the standard protocol following bolus administration of intravenous contrast. CONTRAST:  26mL OMNIPAQUE IOHEXOL 350 MG/ML SOLN COMPARISON:  None available. FINDINGS: Lower chest: No acute airspace disease or pleural effusion. Normal heart size. Hepatobiliary: Focal fatty infiltration adjacent to the falciform ligament. Tiny subcentimeter hypodensity in the right lobe of the liver. Mild hepatic steatosis. Gallbladder physiologically distended, no calcified stone. No biliary dilatation. Pancreas: No ductal dilatation or inflammation. Spleen: Normal in size. No focal abnormality. Lobulated  splenic contour. Adrenals/Urinary Tract: Mild adrenal thickening without dominant adrenal nodule. No hydronephrosis or perinephric edema. Homogeneous renal enhancement with symmetric excretion on delayed phase imaging. No evidence of focal renal lesion or stone. Urinary bladder is physiologically distended without wall thickening. Stomach/Bowel: Suspected perirectal fluid collection just to the left of midline, series 2, image 85. This is only partially included in the field of view. Included portion measures 9 mm. There is mild distal rectal wall thickening. Colonic diverticulosis without focal diverticulitis. Normal appendix.  No small bowel obstruction or inflammation. Decompressed stomach. Vascular/Lymphatic: Moderate aortic atherosclerosis. No aortic aneurysm. Patent portal vein. Prominent left inguinal and external nodes, largest measuring 11 mm, likely reactive. Reproductive: Uterus is deviated into the left pelvis, questionable but not definite uterine fibroid. No adnexal mass. Other: Moderate-sized broad-based umbilical hernia. No abdominopelvic ascites or free fluid. No free intra-abdominal air. Musculoskeletal: Lumbar degenerative change with degenerative disc disease and moderate facet hypertrophy. Bilateral hip osteoarthritis. There is degenerative change of the right sacroiliac joint. Bone island in the left anterior acetabulum. IMPRESSION: 1. Suspected perirectal fluid collection just to the left of midline measuring 9 mm. This is only partially included in the field of view. Mild rectal wall thickening. 2. Prominent left external and inguinal iliac nodes are likely reactive. 3. Colonic diverticulosis without focal diverticulitis. 4. Mild hepatic steatosis. 5. Moderate-sized broad-based umbilical hernia. Aortic Atherosclerosis (ICD10-I70.0). Electronically Signed   By: Keith Rake M.D.   On: 02/04/2021 18:52    Procedures .Marland KitchenIncision and Drainage  Date/Time: 02/04/2021 8:43 PM Performed by:  Teressa Lower, MD Authorized by: Teressa Lower, MD   Consent:    Consent obtained:  Verbal   Consent given by:  Patient   Risks discussed:  Bleeding, incomplete drainage, infection and pain   Alternatives discussed:  No treatment Location:    Type:  Abscess   Size:  2   Location: perianal. Pre-procedure details:    Skin preparation:  Povidone-iodine Sedation:    Sedation type:  None Anesthesia:    Anesthesia method:  Local infiltration   Local anesthetic:  Lidocaine 1% w/o epi Procedure type:    Complexity:  Simple Procedure details:    Ultrasound guidance: no     Incision types:  Stab incision   Drainage:  Purulent and bloody   Drainage amount:  Moderate   Wound treatment:  Wound left open Post-procedure details:    Procedure completion:  Tolerated well, no immediate complications   Medications Ordered in ED Medications  HYDROcodone-acetaminophen (NORCO/VICODIN) 5-325 MG per tablet 1 tablet (1 tablet Oral Given 02/04/21 1655)  HYDROcodone-acetaminophen (NORCO/VICODIN) 5-325 MG per tablet 1 tablet (1 tablet Oral Given 02/04/21 1945)  iohexol (OMNIPAQUE) 350 MG/ML injection 80 mL (80 mLs Intravenous Contrast Given 02/04/21 1818)  lidocaine (PF) (XYLOCAINE) 1 % injection 5 mL (5 mLs Intradermal Given 02/04/21 1945)    ED Course  I have reviewed the triage vital signs and the nursing notes.  Pertinent labs & imaging results that were available during my care of the patient were reviewed by me and considered in my medical decision making (see chart for details).    MDM Rules/Calculators/A&P                           Patient seen emergency department for evaluation of a suspected perianal abscess.  Physical exam reveals a 2 cm perineal abscess on the right.  Laboratory evaluation is unremarkable.  As the patient has now had at least 3 or 4 of these perianal abscesses, CT abdomen pelvis was obtained to rule out fistula formation or deep space abscess.  This was  reassuringly negative but does show peri rectal swelling as expected.  The abscess was drained at bedside with expression of moderate purulent and bloody fluid.  Wound dressed and I spoke with general surgery who will assist the patient in setting up outpatient colorectal follow-up.  Patient then discharged. Final Clinical Impression(s) / ED Diagnoses Final diagnoses:  Perianal abscess  Rx / DC Orders ED Discharge Orders     None        Vermelle Cammarata, Debe Coder, MD 02/04/21 2045

## 2023-02-17 IMAGING — MR MR LUMBAR SPINE W/O CM
4 of 6 series · 21 of 48 positions shown · non-contrast
Comparison: Radiography 01/08/2020

CLINICAL DATA: Lumbar spine pain radiating into the left leg

EXAM:
MRI LUMBAR SPINE WITHOUT CONTRAST
TECHNIQUE: Multiplanar, multisequence MR imaging of the lumbar spine was
performed. No intravenous contrast was administered.

[Series 5: T2 · sagittal · 4.0mm · 0.73mm/px · 5 of 15 slices shown (1 of 2)]
[im 1/15]
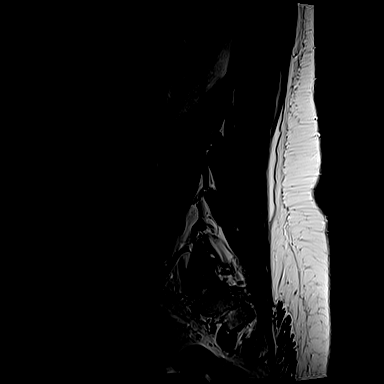
[im 4/15]
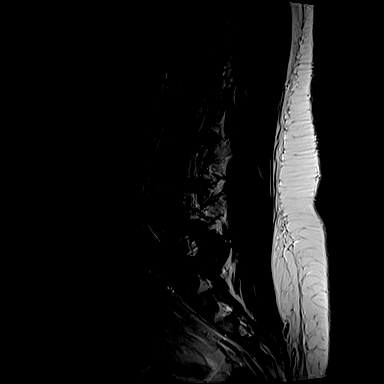
[im 8/15]
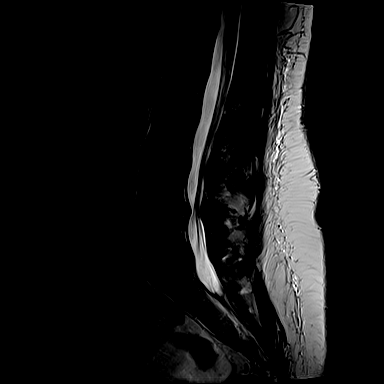
[im 11/15]
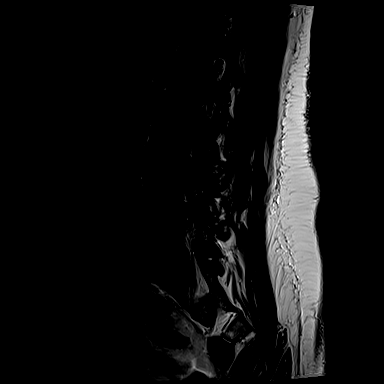
[im 15/15]
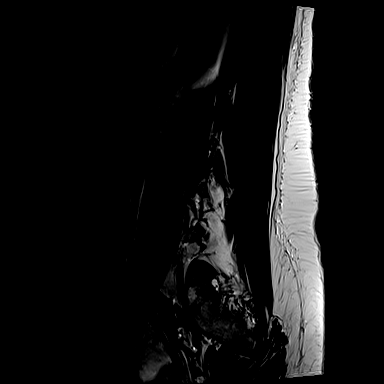

[Series 6: T1 · sagittal · 4.0mm · 0.73mm/px · 4 of 15 slices shown (1 of 2)]
[im 1/15]
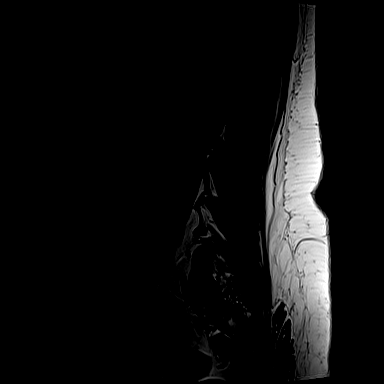
[im 4/15]
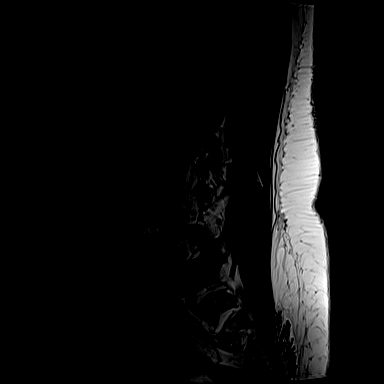
[im 8/15]
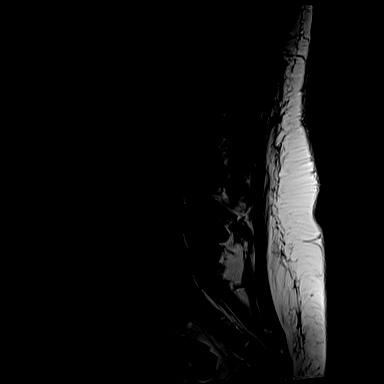
[im 15/15]
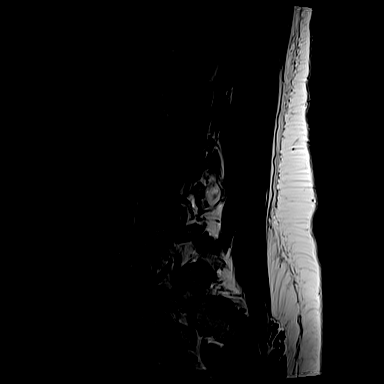

[Series 8: T1 · axial · 4.0mm · 0.35mm/px · z∈[+30,+90]mm · 3 of 20 slices shown (2 of 2)]
[im 4/20]
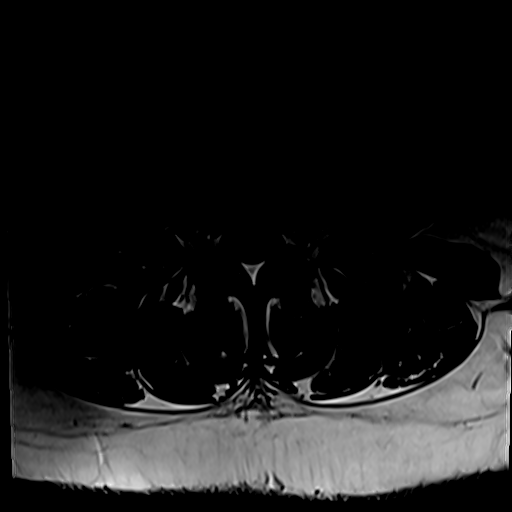
[im 10/20]
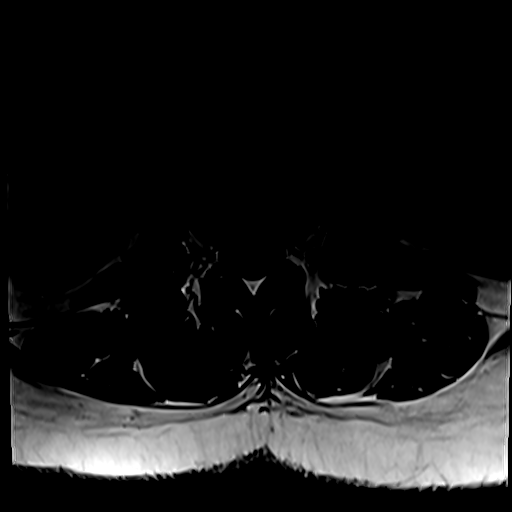
[im 16/20]
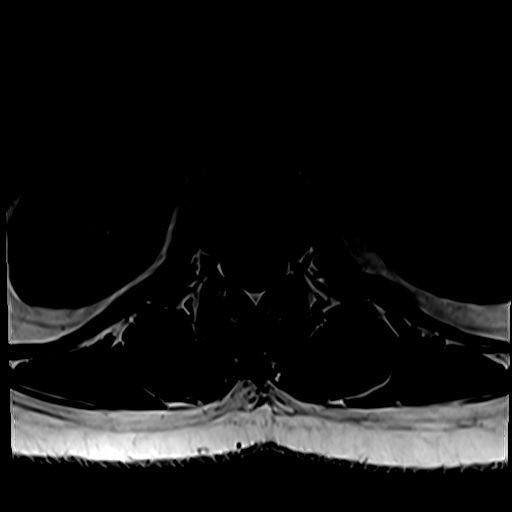

[Series 13: T2 · axial · 4.0mm · 0.28mm/px · z∈[-103,+109]mm · 9 of 39 slices shown (2 of 2)]
[im 1/39]
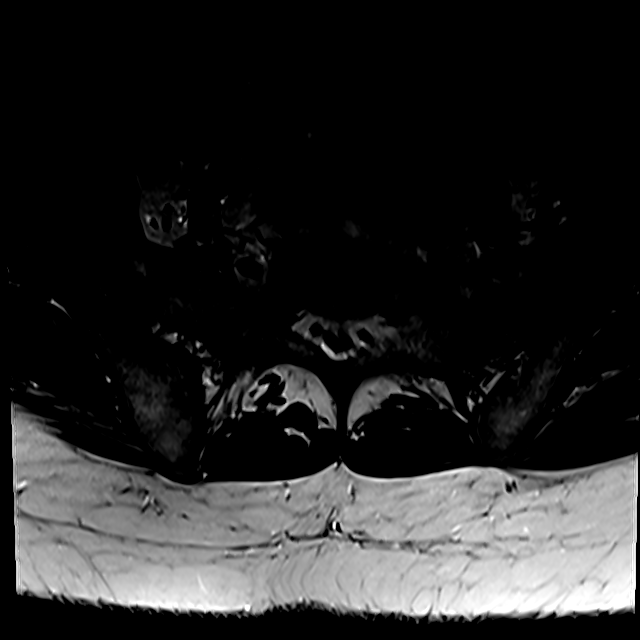
[im 7/39]
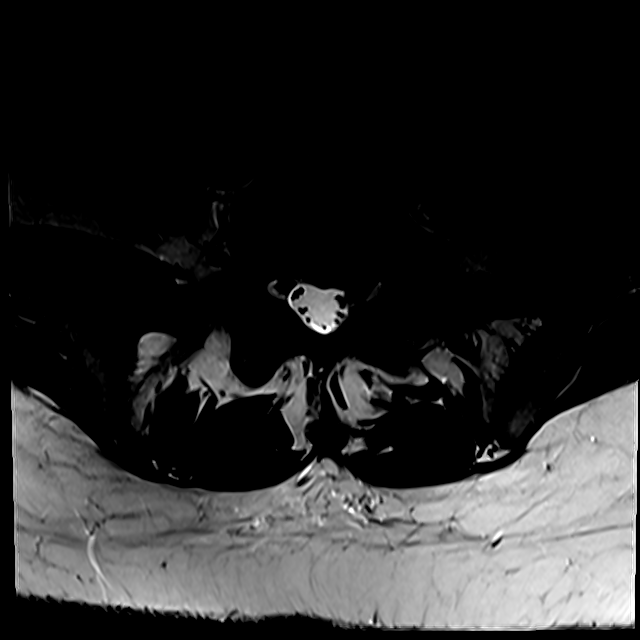
[im 13/39]
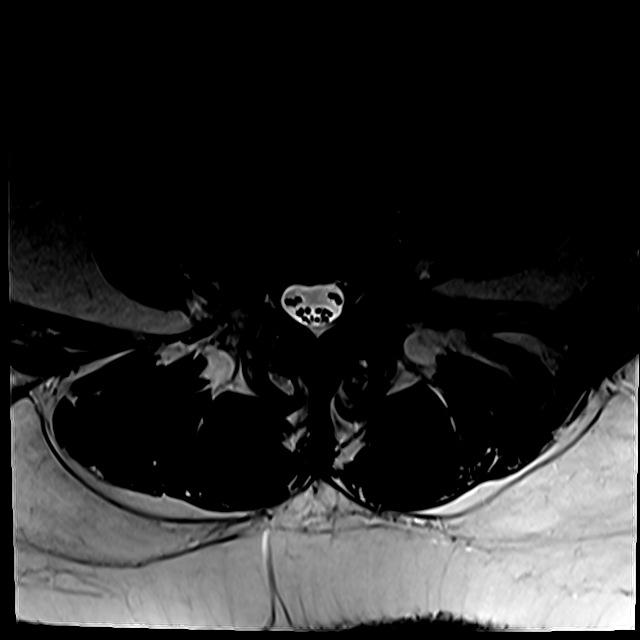
[im 16/39]
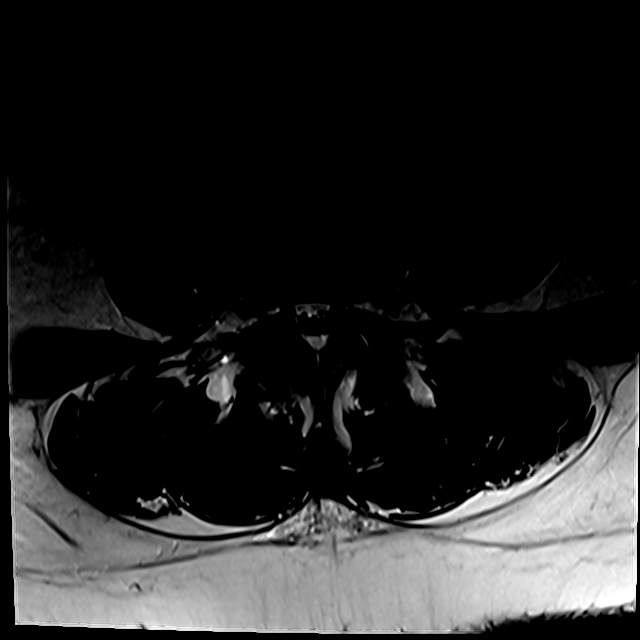
[im 20/39]
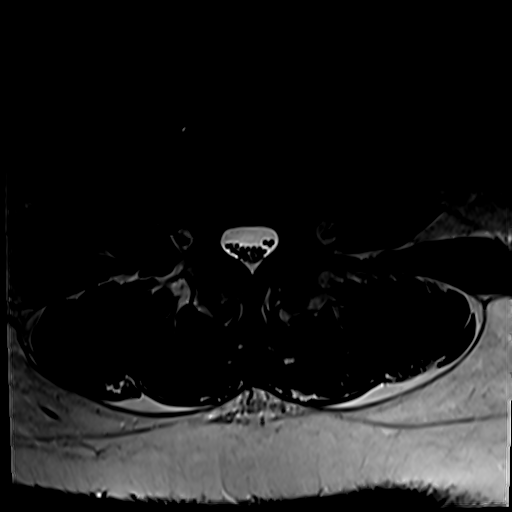
[im 23/39]
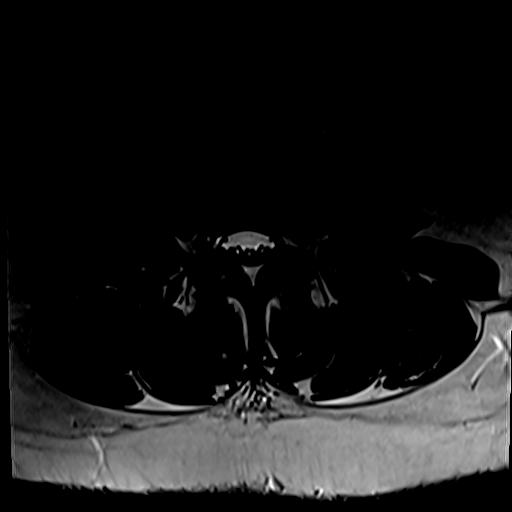
[im 26/39]
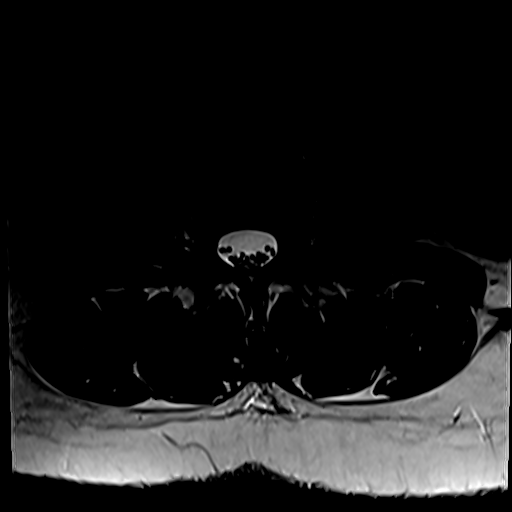
[im 32/39]
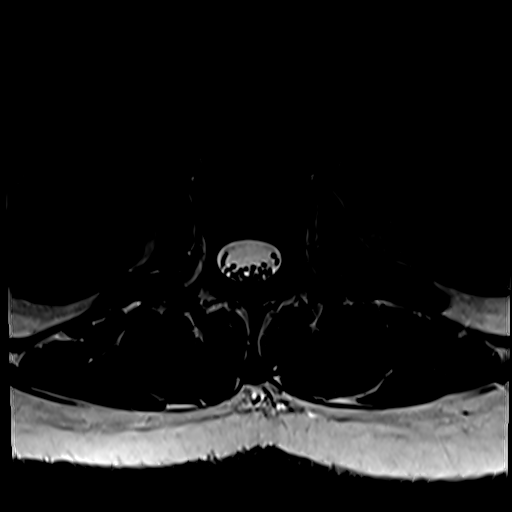
[im 39/39]
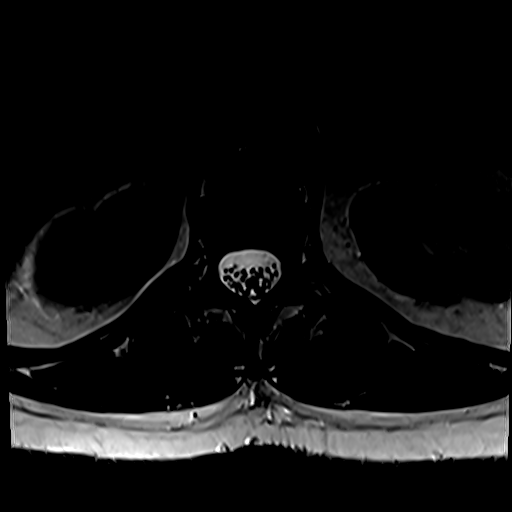

[21 of 48 positions shown; findings below may reference images not displayed]

FINDINGS: Segmentation:  5 lumbar type vertebrae

Alignment:  Grade 1 degenerative anterolisthesis at L4-5

Vertebrae: Marrow edema associated with the bilateral L4-5 facets.
No evidence of fracture or bone lesion

Conus medullaris and cauda equina: Conus extends to the L1-2 level.
Conus and cauda equina appear normal.

Paraspinal and other soft tissues: Mild arthritic edematous signal
around the L4-5 facets with small posterior synovial cysts on both
sides

Disc levels:

L3-L4: Mild annulus bulging and degenerative facet spurring.

L4-L5: Degenerative facet spurring with marrow edema. Disc narrowing
and circumferential bulging. Bilateral subarticular recess narrowing
without static compression. Mild bilateral foraminal crowding.

L5-S1:Facet osteoarthritis with bilateral moderate spurring. Left
paracentral downward pointing protrusion posteriorly displacing the
left S1 nerve root.
IMPRESSION: 1. Facet osteoarthritis at L3-4 and below, most notable at L4-5
where there is anterolisthesis and bilateral marrow edema.
2. L5-S1 left paracentral protrusion impinging on the left S1 nerve
root.
3. L4-5 bilateral subarticular recess narrowing without static
compression.

## 2023-05-12 ENCOUNTER — Emergency Department (HOSPITAL_COMMUNITY)
Admission: EM | Admit: 2023-05-12 | Discharge: 2023-05-13 | Discharge status: Against Medical Advice | Payer: Medicaid Other | Attending: Emergency Medicine | Admitting: Emergency Medicine

## 2023-05-12 DIAGNOSIS — K649 Unspecified hemorrhoids: Secondary | ICD-10-CM | POA: Diagnosis present

## 2023-05-12 DIAGNOSIS — Z5321 Procedure and treatment not carried out due to patient leaving prior to being seen by health care provider: Secondary | ICD-10-CM | POA: Insufficient documentation

## 2023-05-13 NOTE — ED Notes (Signed)
 The pt reports that she waited too long to be seen and her hemorrhoid in her rectum ruptured and that she does not need to be seen now  she signed the ama form and left

## 2023-05-13 NOTE — ED Notes (Signed)
 Patient stated she was leaving. If she have any more problems she stated she will come back.

## 2023-06-14 ENCOUNTER — Other Ambulatory Visit: Payer: Self-pay | Admitting: Family Medicine

## 2023-06-14 DIAGNOSIS — Z1231 Encounter for screening mammogram for malignant neoplasm of breast: Secondary | ICD-10-CM

## 2023-07-17 ENCOUNTER — Emergency Department (HOSPITAL_COMMUNITY)
Admission: EM | Admit: 2023-07-17 | Discharge: 2023-07-17 | Disposition: A | Discharge status: Home / Self Care | Attending: Emergency Medicine | Admitting: Emergency Medicine

## 2023-07-17 ENCOUNTER — Other Ambulatory Visit: Payer: Self-pay

## 2023-07-17 ENCOUNTER — Encounter (HOSPITAL_COMMUNITY): Payer: Self-pay | Admitting: *Deleted

## 2023-07-17 ENCOUNTER — Emergency Department (HOSPITAL_COMMUNITY)

## 2023-07-17 DIAGNOSIS — K529 Noninfective gastroenteritis and colitis, unspecified: Secondary | ICD-10-CM | POA: Insufficient documentation

## 2023-07-17 DIAGNOSIS — J189 Pneumonia, unspecified organism: Secondary | ICD-10-CM | POA: Insufficient documentation

## 2023-07-17 DIAGNOSIS — R1084 Generalized abdominal pain: Secondary | ICD-10-CM | POA: Diagnosis present

## 2023-07-17 LAB — COMPREHENSIVE METABOLIC PANEL WITH GFR
ALT: 11 U/L (ref 0–44)
AST: 15 U/L (ref 15–41)
Albumin: 3.6 g/dL (ref 3.5–5.0)
Alkaline Phosphatase: 57 U/L (ref 38–126)
Anion gap: 9 (ref 5–15)
BUN: 13 mg/dL (ref 6–20)
CO2: 25 mmol/L (ref 22–32)
Calcium: 9.2 mg/dL (ref 8.9–10.3)
Chloride: 107 mmol/L (ref 98–111)
Creatinine, Ser: 0.87 mg/dL (ref 0.44–1.00)
GFR, Estimated: >60 mL/min (ref >60)
Glucose, Bld: 114 mg/dL — ABNORMAL HIGH (ref 70–99)
Potassium: 3.9 mmol/L (ref 3.5–5.1)
Sodium: 141 mmol/L (ref 135–145)
Total Bilirubin: 0.5 mg/dL (ref 0.0–1.2)
Total Protein: 6.9 g/dL (ref 6.5–8.1)

## 2023-07-17 LAB — CBC WITH DIFFERENTIAL/PLATELET
Abs Immature Granulocytes: 0.02 10*3/uL (ref 0.00–0.07)
Basophils Absolute: 0.1 10*3/uL (ref 0.0–0.1)
Basophils Relative: 1 %
Eosinophils Absolute: 0.1 10*3/uL (ref 0.0–0.5)
Eosinophils Relative: 1 %
HCT: 39.9 % (ref 36.0–46.0)
Hemoglobin: 13 g/dL (ref 12.0–15.0)
Immature Granulocytes: 0 %
Lymphocytes Relative: 43 %
Lymphs Abs: 2.8 10*3/uL (ref 0.7–4.0)
MCH: 28.4 pg (ref 26.0–34.0)
MCHC: 32.6 g/dL (ref 30.0–36.0)
MCV: 87.3 fL (ref 80.0–100.0)
Monocytes Absolute: 0.6 10*3/uL (ref 0.1–1.0)
Monocytes Relative: 9 %
Neutro Abs: 3 10*3/uL (ref 1.7–7.7)
Neutrophils Relative %: 46 %
Platelets: 309 10*3/uL (ref 150–400)
RBC: 4.57 MIL/uL (ref 3.87–5.11)
RDW: 15 % (ref 11.5–15.5)
WBC: 6.6 10*3/uL (ref 4.0–10.5)
nRBC: 0 % (ref 0.0–0.2)

## 2023-07-17 LAB — RESP PANEL BY RT-PCR (RSV, FLU A&B, COVID)  RVPGX2
Influenza A by PCR: NEGATIVE
Influenza B by PCR: NEGATIVE
Resp Syncytial Virus by PCR: NEGATIVE
SARS Coronavirus 2 by RT PCR: NEGATIVE

## 2023-07-17 LAB — URINALYSIS, ROUTINE W REFLEX MICROSCOPIC
Bacteria, UA: NONE SEEN
Bilirubin Urine: NEGATIVE
Glucose, UA: NEGATIVE mg/dL
Ketones, ur: NEGATIVE mg/dL
Leukocytes,Ua: NEGATIVE
Nitrite: NEGATIVE
Protein, ur: NEGATIVE mg/dL
Specific Gravity, Urine: 1.024 (ref 1.005–1.030)
pH: 5 (ref 5.0–8.0)

## 2023-07-17 LAB — LIPASE, BLOOD: Lipase: 36 U/L (ref 11–51)

## 2023-07-17 MED ORDER — IOHEXOL 350 MG/ML SOLN
75.0000 mL | Freq: Once | INTRAVENOUS | Status: AC | PRN
  Administered 2023-07-17: 75 mL via INTRAVENOUS

## 2023-07-17 MED ORDER — AZITHROMYCIN 250 MG PO TABS: 250.0000 mg | ORAL_TABLET | Freq: Every day | ORAL | 0 refills | Status: DC

## 2023-07-17 MED ORDER — LOPERAMIDE HCL 2 MG PO CAPS: 2.0000 mg | ORAL_CAPSULE | Freq: Four times a day (QID) | ORAL | 0 refills | Status: DC | PRN

## 2023-07-17 MED ORDER — ONDANSETRON 4 MG PO TBDP
4.0000 mg | ORAL_TABLET | Freq: Once | ORAL | Status: AC
  Administered 2023-07-17: 4 mg via ORAL
  Filled 2023-07-17: qty 1

## 2023-07-17 MED ORDER — ONDANSETRON 4 MG PO TBDP: 4.0000 mg | ORAL_TABLET | Freq: Three times a day (TID) | ORAL | 0 refills | Status: AC | PRN

## 2023-07-17 NOTE — ED Provider Notes (Signed)
 [place] EMERGENCY DEPARTMENT AT [facility] Provider Note   CSN: [id number] Arrival date & time: 07/17/23  1950    History  Chief Complaint  Patient presents with   Generalized Body Aches    Joanna Phelps is a 63 y.o. female here for evaluation of nausea, NBNB vomiting and diarrhea over the last week.  No sick contacts however has been visiting her significant other in a nursing home over the last week.  She is having multiple episodes of loose stool daily without any blood.  No recent antibiotics or travel.  She has had a prior colonoscopy, no history of diverticulitis.  Some generalized abdominal cramping however no focal pain.  No fever, chest pain, shortness of breath.  She has some chronic right lower back pain which goes into her leg which she states is related to her known sciatica.  No changes for this.  No bowel or bladder incontinence, saddle paresthesia.  She is ambulatory PTA.  She was given Zofran in triage which improved her symptoms.  Taking Pepto-Bismol at home.  HPI     Home Medications Prior to Admission medications   Medication Sig Start Date End Date Taking? Authorizing Provider  azithromycin (ZITHROMAX) 250 MG tablet Take 1 tablet (250 mg total) by mouth daily. Take first 2 tablets together, then 1 every day until finished. 07/17/23  Yes Rashon Rezek A, PA-C  loperamide (IMODIUM) 2 MG capsule Take 1 capsule (2 mg total) by mouth 4 (four) times daily as needed for diarrhea or loose stools. 07/17/23  Yes Kilah Drahos A, PA-C  ondansetron (ZOFRAN-ODT) 4 MG disintegrating tablet Take 1 tablet (4 mg total) by mouth every 8 (eight) hours as needed. 07/17/23  Yes Trevion Hoben A, PA-C  amLODipine  (NORVASC ) 10 MG tablet Take 1 tablet (10 mg total) by mouth daily. 05/27/20   Marius Siemens, NP  atorvastatin  (LIPITOR) 20 MG tablet Take 1 tablet (20 mg total) by mouth daily. 05/27/20   Marius Siemens, NP  baclofen  (LIORESAL ) 10 MG tablet Take 0.5-1  tablets (5-10 mg total) by mouth at bedtime as needed for muscle spasms. Patient not taking: Reported on 02/04/2021 01/08/20   Hilts, Bambi Lever, MD  buPROPion  (WELLBUTRIN  SR) 150 MG 12 hr tablet Take 1 tablet (150 mg total) by mouth 2 (two) times daily. Patient not taking: Reported on 12/09/2020 05/27/20   Marius Siemens, NP  celecoxib  (CELEBREX ) 200 MG capsule Take 1 capsule (200 mg total) by mouth 2 (two) times daily as needed. Patient not taking: Reported on 12/09/2020 01/08/20   Hilts, Bambi Lever, MD  diphenhydramine-acetaminophen  (TYLENOL  PM) 25-500 MG TABS tablet Take 1 tablet by mouth at bedtime as needed (sleep).    [provider]  hydrochlorothiazide  (HYDRODIURIL ) 25 MG tablet Take 1 tablet (25 mg total) by mouth daily. 05/27/20   Marius Siemens, NP      Allergies    Ibuprofen    Review of Systems   Review of Systems  Constitutional: Negative.   HENT: Negative.    Respiratory: Negative.    Cardiovascular: Negative.   Gastrointestinal:  Positive for abdominal pain, diarrhea, nausea and vomiting. Negative for abdominal distention, anal bleeding, blood in stool, constipation and rectal pain.  Genitourinary: Negative.   Musculoskeletal: Negative.   Skin: Negative.   Neurological: Negative.   All other systems reviewed and are negative.   Physical Exam Updated Vital Signs BP 134/85   Pulse 70   Temp 98.6 F (37 C) (Oral)  Resp 14   Ht 5\' 6"  (1.676 m)   Wt 89.4 kg   SpO2 100%   BMI 31.81 kg/m  Physical Exam Vitals and nursing note reviewed.  Constitutional:      General: She is not in acute distress.    Appearance: She is well-developed. She is not ill-appearing, toxic-appearing or diaphoretic.  HENT:     Head: Normocephalic and atraumatic.     Nose: Nose normal.     Mouth/Throat:     Mouth: Mucous membranes are moist.  Eyes:     Pupils: Pupils are equal, round, and reactive to light.  Cardiovascular:     Rate and Rhythm: Normal rate.     Pulses:  Normal pulses.     Heart sounds: Normal heart sounds.  Pulmonary:     Effort: Pulmonary effort is normal. No respiratory distress.     Breath sounds: Normal breath sounds.  Abdominal:     General: Bowel sounds are normal. There is no distension.     Palpations: Abdomen is soft.     Tenderness: There is no abdominal tenderness. There is no right CVA tenderness, left CVA tenderness, guarding or rebound.  Musculoskeletal:        General: Normal range of motion.     Cervical back: Normal range of motion.  Skin:    General: Skin is warm and dry.     Capillary Refill: Capillary refill takes less than 2 seconds.  Neurological:     General: No focal deficit present.     Mental Status: She is alert.     Cranial Nerves: No cranial nerve deficit.     Gait: Gait normal.  Psychiatric:        Mood and Affect: Mood normal.     ED Results / Procedures / Treatments   Labs (all labs ordered are listed, but only abnormal results are displayed) Labs Reviewed  COMPREHENSIVE METABOLIC PANEL WITH GFR - Abnormal; Notable for the following components:      Result Value   Glucose, Bld 114 (*)    All other components within normal limits  URINALYSIS, ROUTINE W REFLEX MICROSCOPIC - Abnormal; Notable for the following components:   APPearance HAZY (*)    Hgb urine dipstick SMALL (*)    All other components within normal limits  RESP PANEL BY RT-PCR (RSV, FLU A&B, COVID)  RVPGX2  C DIFFICILE QUICK SCREEN W PCR REFLEX    GASTROINTESTINAL PANEL BY PCR, STOOL (REPLACES STOOL CULTURE)  CBC WITH DIFFERENTIAL/PLATELET  LIPASE, BLOOD    EKG None  Radiology CT ABDOMEN PELVIS W CONTRAST Result Date: 07/17/2023 CLINICAL DATA:  Generalized body aches and diarrhea EXAM: CT ABDOMEN AND PELVIS WITH CONTRAST TECHNIQUE: Multidetector CT imaging of the abdomen and pelvis was performed using the standard protocol following bolus administration of intravenous contrast. RADIATION DOSE REDUCTION: This exam was  performed according to the departmental dose-optimization program which includes automated exposure control, adjustment of the mA and/or kV according to patient size and/or use of iterative reconstruction technique. CONTRAST:  75mL OMNIPAQUE  IOHEXOL  350 MG/ML SOLN COMPARISON:  CT abdomen pelvis 02/04/2021 FINDINGS: Lower chest: Ground-glass opacities in the left lower lobe likely due to pneumonia. Hepatobiliary: Unremarkable liver, gallbladder, and biliary tree. Pancreas: Unremarkable. Spleen: Unremarkable. Adrenals/Urinary Tract: Stable adrenal glands. No urinary calculi or hydronephrosis. Unremarkable bladder. Stomach/Bowel: Stomach is within normal limits. Prominent fluid-filled loop of small bowel in the right lower quadrant at the upper limits of normal in caliber. No discrete transition point. Normal  appendix. Normal caliber colon without wall thickening. Vascular/Lymphatic: Aortic atherosclerosis. No enlarged abdominal or pelvic lymph nodes. Reproductive: Uterus and bilateral adnexa are unremarkable. Other: No free intraperitoneal fluid or air. Musculoskeletal: No acute fracture. IMPRESSION: 1. Ground-glass opacities in the left lower lobe likely due to pneumonia. 2. Prominent fluid-filled loop of small bowel in the right lower quadrant at the upper limits of normal in caliber. No discrete transition point. Findings are nonspecific but can be seen in the setting of enteritis. Early or partial obstruction is considered less likely though not excluded. 3. Aortic Atherosclerosis (ICD10-I70.0). Electronically Signed   By: Rozell Cornet M.D.   On: 07/17/2023 22:33    Procedures Procedures    Medications Ordered in ED Medications  ondansetron (ZOFRAN-ODT) disintegrating tablet 4 mg (4 mg Oral Given 07/17/23 2030)  iohexol  (OMNIPAQUE ) 350 MG/ML injection 75 mL (75 mLs Intravenous Contrast Given 07/17/23 2224)   ED Course/ Medical Decision Making/ A&P   63 year old here for evaluation N/V/D.  She cannot  quantify how many episodes she is having.  Has some diffuse abdominal cramping prior to bowel movement and then resolves.  No blood in stool or emesis.  No recent antibiotics or travel.  Symptoms did start after she was visiting her significant other nursing home daily.  No known exposures to C. difficile.  Nausea improved with Zofran from triage does not want additional antiemetics plan on labs, imaging and reassess  Labs and imaging personally viewed and interpreted:  CBC without leukocytosis Metabolic panel without significant abnormality Lipase 36 UA negative for infection  CT abdomen pelvis shows enteritis possible pneumonia.  She does states she has an occasional cough, no chest pain or shortness of breath.  Will treat with antibiotics.  Also discussed Zofran and Imodium as needed for her GI symptoms.  Her viral panel is negative.  She was unable to provide stool sample here in the emergency department.  She will follow-up outpatient, return for new or worsening symptoms.  The patient has been appropriately medically screened and/or stabilized in the ED. I have low suspicion for any other emergent medical condition which would require further screening, evaluation or treatment in the ED or require inpatient management.  Patient is hemodynamically stable and in no acute distress.  Patient able to ambulate in department prior to ED.  Evaluation does not show acute pathology that would require ongoing or additional emergent interventions while in the emergency department or further inpatient treatment.  I have discussed the diagnosis with the patient and answered all questions.  Pain is been managed while in the emergency department and patient has no further complaints prior to discharge.  Patient is comfortable with plan discussed in room and is stable for discharge at this time.  I have discussed strict return precautions for returning to the emergency department.  Patient was encouraged to  follow-up with PCP/specialist refer to at discharge.                                Medical Decision Making Amount and/or Complexity of Data Reviewed External Data Reviewed: labs, radiology and notes. Labs: ordered. Decision-making details documented in ED Course. Radiology: ordered and independent interpretation performed. Decision-making details documented in ED Course.  Risk OTC drugs. Prescription drug management. Decision regarding hospitalization. Diagnosis or treatment significantly limited by social determinants of health.           Final Clinical Impression(s) / ED Diagnoses Final  diagnoses:  Enteritis  Pneumonia of left lower lobe due to infectious organism    Rx / DC Orders ED Discharge Orders          Ordered    ondansetron (ZOFRAN-ODT) 4 MG disintegrating tablet  Every 8 hours PRN        07/17/23 2244    loperamide (IMODIUM) 2 MG capsule  4 times daily PRN        07/17/23 2244    azithromycin (ZITHROMAX) 250 MG tablet  Daily        07/17/23 2244              Kerwin Augustus A, PA-C 07/17/23 2311    Almond Army, MD 07/17/23 2324

## 2023-07-17 NOTE — Discharge Instructions (Signed)
 It was a pleasure taking care of you here today  I have written you for a few medications to help with your symptoms  Azithromycin-this is an antibiotic Imodium-this is for diarrhea Zofran-as for nausea vomiting  Make sure to follow-up outpatient, return for any worsening symptom

## 2023-07-17 NOTE — ED Provider Triage Note (Signed)
 Emergency Medicine Provider Triage Evaluation Note  Joanna Phelps , a 63 y.o. female  was evaluated in triage.  Pt complains of nausea, vomiting and diarrhea for 1 week.  Denies any known sick contacts.  Denies any suspicious food intake.  Denies blood in stool, fevers, dysuria or flank pain.  Also complaining of back pain that extends from her right low back into her right leg, she states that she feels that this is related to her sciatica.  She is also concerned about a "spot" on her left upper back.  It appears to be some kind of cyst versus abscess.  Denies chest pain, shortness of breath.  Review of Systems  Positive:  Negative:   Physical Exam  BP 134/87 (BP Location: Right Arm)   Pulse 69   Temp 98.6 F (37 C) (Oral)   Resp 18   Ht 5\' 6"  (1.676 m)   Wt 89.4 kg   SpO2 100%   BMI 31.81 kg/m  Gen:   Awake, no distress   Resp:  Normal effort  MSK:   Moves extremities without difficulty  Other:  Abdomen soft and compressible  Medical Decision Making  Medically screening exam initiated at 8:17 PM.  Appropriate orders placed.  Joanna Phelps was informed that the remainder of the evaluation will be completed by another provider, this initial triage assessment does not replace that evaluation, and the importance of remaining in the ED until their evaluation is complete.     Joanna Aden, PA-C 07/17/23 2018

## 2023-07-17 NOTE — ED Triage Notes (Signed)
 The pt is vomiting for one week and she has a spot on her back that she has had for years and its hurting today  she is also c/o diarrhea

## 2023-07-19 ENCOUNTER — Ambulatory Visit
Admission: RE | Admit: 2023-07-19 | Discharge: 2023-07-19 | Disposition: A | Discharge status: Home / Self Care | Source: Ambulatory Visit | Attending: Family Medicine | Admitting: Family Medicine

## 2023-07-19 DIAGNOSIS — Z1231 Encounter for screening mammogram for malignant neoplasm of breast: Secondary | ICD-10-CM

## 2023-07-22 ENCOUNTER — Other Ambulatory Visit: Payer: Self-pay | Admitting: Family Medicine

## 2023-07-22 DIAGNOSIS — R928 Other abnormal and inconclusive findings on diagnostic imaging of breast: Secondary | ICD-10-CM

## 2023-07-26 ENCOUNTER — Other Ambulatory Visit: Payer: Self-pay | Admitting: Family Medicine

## 2023-07-26 DIAGNOSIS — R928 Other abnormal and inconclusive findings on diagnostic imaging of breast: Secondary | ICD-10-CM

## 2023-07-27 ENCOUNTER — Ambulatory Visit
Admission: RE | Admit: 2023-07-27 | Discharge: 2023-07-27 | Disposition: A | Discharge status: Home / Self Care | Source: Ambulatory Visit | Attending: Family Medicine | Admitting: Family Medicine

## 2023-07-27 ENCOUNTER — Other Ambulatory Visit: Payer: Self-pay | Admitting: Family Medicine

## 2023-07-27 DIAGNOSIS — R928 Other abnormal and inconclusive findings on diagnostic imaging of breast: Secondary | ICD-10-CM

## 2023-07-27 DIAGNOSIS — R921 Mammographic calcification found on diagnostic imaging of breast: Secondary | ICD-10-CM

## 2023-08-05 ENCOUNTER — Ambulatory Visit
Admission: RE | Admit: 2023-08-05 | Discharge: 2023-08-05 | Disposition: A | Discharge status: Home / Self Care | Source: Ambulatory Visit | Attending: Family Medicine | Admitting: Family Medicine

## 2023-08-05 DIAGNOSIS — R921 Mammographic calcification found on diagnostic imaging of breast: Secondary | ICD-10-CM

## 2023-08-05 HISTORY — PX: BREAST BIOPSY: SHX20

## 2023-08-06 LAB — SURGICAL PATHOLOGY

## 2023-10-16 ENCOUNTER — Emergency Department (HOSPITAL_COMMUNITY)
Admission: EM | Admit: 2023-10-16 | Discharge: 2023-10-16 | Discharge status: Against Medical Advice | Attending: Emergency Medicine | Admitting: Emergency Medicine

## 2023-10-16 ENCOUNTER — Emergency Department (HOSPITAL_COMMUNITY)

## 2023-10-16 ENCOUNTER — Other Ambulatory Visit: Payer: Self-pay

## 2023-10-16 ENCOUNTER — Encounter (HOSPITAL_COMMUNITY): Payer: Self-pay

## 2023-10-16 DIAGNOSIS — M25511 Pain in right shoulder: Secondary | ICD-10-CM | POA: Insufficient documentation

## 2023-10-16 DIAGNOSIS — Z5329 Procedure and treatment not carried out because of patient's decision for other reasons: Secondary | ICD-10-CM | POA: Insufficient documentation

## 2023-10-16 DIAGNOSIS — R0602 Shortness of breath: Secondary | ICD-10-CM | POA: Diagnosis present

## 2023-10-16 DIAGNOSIS — J984 Other disorders of lung: Secondary | ICD-10-CM | POA: Insufficient documentation

## 2023-10-16 DIAGNOSIS — M546 Pain in thoracic spine: Secondary | ICD-10-CM | POA: Insufficient documentation

## 2023-10-16 DIAGNOSIS — Z720 Tobacco use: Secondary | ICD-10-CM | POA: Diagnosis not present

## 2023-10-16 LAB — COMPREHENSIVE METABOLIC PANEL WITH GFR
ALT: 13 U/L (ref 0–44)
AST: 16 U/L (ref 15–41)
Albumin: 3.7 g/dL (ref 3.5–5.0)
Alkaline Phosphatase: 62 U/L (ref 38–126)
Anion gap: 10 (ref 5–15)
BUN: 12 mg/dL (ref 6–20)
CO2: 25 mmol/L (ref 22–32)
Calcium: 8.9 mg/dL (ref 8.9–10.3)
Chloride: 105 mmol/L (ref 98–111)
Creatinine, Ser: 0.74 mg/dL (ref 0.44–1.00)
GFR, Estimated: >60 mL/min (ref >60)
Glucose, Bld: 93 mg/dL (ref 70–99)
Potassium: 3.6 mmol/L (ref 3.5–5.1)
Sodium: 140 mmol/L (ref 135–145)
Total Bilirubin: 0.4 mg/dL (ref 0.0–1.2)
Total Protein: 7.7 g/dL (ref 6.5–8.1)

## 2023-10-16 LAB — CBC WITH DIFFERENTIAL/PLATELET
Basophils Absolute: 0 K/uL (ref 0.0–0.1)
Basophils Relative: 0 %
Eosinophils Absolute: 0 K/uL (ref 0.0–0.5)
Eosinophils Relative: 0 %
HCT: 36.8 % (ref 36.0–46.0)
Hemoglobin: 11.8 g/dL — ABNORMAL LOW (ref 12.0–15.0)
Lymphocytes Relative: 55 %
Lymphs Abs: 2.9 K/uL (ref 0.7–4.0)
MCH: 27.8 pg (ref 26.0–34.0)
MCHC: 32.1 g/dL (ref 30.0–36.0)
MCV: 86.8 fL (ref 80.0–100.0)
Monocytes Absolute: 0.3 K/uL (ref 0.1–1.0)
Monocytes Relative: 5 %
Neutro Abs: 2.1 K/uL (ref 1.7–7.7)
Neutrophils Relative %: 40 %
Platelets: 278 K/uL (ref 150–400)
RBC: 4.24 MIL/uL (ref 3.87–5.11)
RDW: 14.6 % (ref 11.5–15.5)
WBC: 5.3 K/uL (ref 4.0–10.5)
nRBC: 0 % (ref 0.0–0.2)

## 2023-10-16 LAB — TROPONIN I (HIGH SENSITIVITY)
Troponin I (High Sensitivity): 4 ng/L (ref <18)
Troponin I (High Sensitivity): 4 ng/L (ref <18)

## 2023-10-16 LAB — BRAIN NATRIURETIC PEPTIDE: B Natriuretic Peptide: 32.3 pg/mL (ref 0.0–100.0)

## 2023-10-16 LAB — D-DIMER, QUANTITATIVE: D-Dimer, Quant: <0.27 ug{FEU}/mL (ref 0.00–0.50)

## 2023-10-16 MED ORDER — IOHEXOL 350 MG/ML SOLN
50.0000 mL | Freq: Once | INTRAVENOUS | Status: AC | PRN
  Administered 2023-10-16: 50 mL via INTRAVENOUS

## 2023-10-16 NOTE — ED Provider Notes (Signed)
 [place] EMERGENCY DEPARTMENT AT [facility] Provider Note   CSN: [id number] Arrival date & time: 10/16/23  1630     Patient presents with: Back Pain and Shortness of Breath   Joanna Phelps is a 63 y.o. female.  Patient with past history significant for tobacco use presents emergency department concerns of back pain and shortness of breath.  She reports that she has been having mid back pain that has been intermittently worsening waking her up in the middle of the night due to feelings that she cannot catch her breath.  States that these symptoms will occur randomly without any obvious predictive quality.  No reported leg swelling or weight gain.  Denies any hemoptysis, fever, chills or bodyaches.  No sick contacts.   Back Pain Shortness of Breath      Prior to Admission medications   Medication Sig Start Date End Date Taking? Authorizing Provider  amLODipine  (NORVASC ) 10 MG tablet Take 1 tablet (10 mg total) by mouth daily. 05/27/20   Celestia Rosaline SQUIBB, NP  atorvastatin  (LIPITOR) 20 MG tablet Take 1 tablet (20 mg total) by mouth daily. 05/27/20   Celestia Rosaline SQUIBB, NP  azithromycin  (ZITHROMAX ) 250 MG tablet Take 1 tablet (250 mg total) by mouth daily. Take first 2 tablets together, then 1 every day until finished. 07/17/23   Henderly, Britni A, PA-C  baclofen  (LIORESAL ) 10 MG tablet Take 0.5-1 tablets (5-10 mg total) by mouth at bedtime as needed for muscle spasms. Patient not taking: Reported on 02/04/2021 01/08/20   Hilts, Ozell, MD  buPROPion  (WELLBUTRIN  SR) 150 MG 12 hr tablet Take 1 tablet (150 mg total) by mouth 2 (two) times daily. Patient not taking: Reported on 12/09/2020 05/27/20   Celestia Rosaline SQUIBB, NP  celecoxib  (CELEBREX ) 200 MG capsule Take 1 capsule (200 mg total) by mouth 2 (two) times daily as needed. Patient not taking: Reported on 12/09/2020 01/08/20   Hilts, Ozell, MD  diphenhydramine-acetaminophen  (TYLENOL  PM) 25-500 MG TABS tablet Take 1 tablet by  mouth at bedtime as needed (sleep).    [provider]  hydrochlorothiazide  (HYDRODIURIL ) 25 MG tablet Take 1 tablet (25 mg total) by mouth daily. 05/27/20   Celestia Rosaline SQUIBB, NP  loperamide  (IMODIUM ) 2 MG capsule Take 1 capsule (2 mg total) by mouth 4 (four) times daily as needed for diarrhea or loose stools. 07/17/23   Henderly, Britni A, PA-C  ondansetron  (ZOFRAN -ODT) 4 MG disintegrating tablet Take 1 tablet (4 mg total) by mouth every 8 (eight) hours as needed. 07/17/23   Henderly, Britni A, PA-C    Allergies: Ibuprofen    Review of Systems  Respiratory:  Positive for shortness of breath.   Musculoskeletal:  Positive for back pain.  All other systems reviewed and are negative.   Updated Vital Signs BP 103/68   Pulse 64   Temp 98.5 F (36.9 C) (Oral)   Resp (!) 22   Ht 5' 6 (1.676 m)   Wt 72.1 kg   SpO2 100%   BMI 25.66 kg/m   Physical Exam Vitals and nursing note reviewed.  Constitutional:      General: She is not in acute distress.    Appearance: She is well-developed.  HENT:     Head: Normocephalic and atraumatic.  Eyes:     Conjunctiva/sclera: Conjunctivae normal.  Cardiovascular:     Rate and Rhythm: Normal rate and regular rhythm.     Heart sounds: No murmur heard. Pulmonary:     Effort: Pulmonary  effort is normal. No respiratory distress.     Breath sounds: Normal breath sounds. No decreased breath sounds, wheezing, rhonchi or rales.  Chest:     Chest wall: No tenderness or edema.  Abdominal:     Palpations: Abdomen is soft.     Tenderness: There is no abdominal tenderness.  Musculoskeletal:        General: No swelling.     Cervical back: Neck supple.     Comments: Minimal TTP along the right posterior shoulder  Skin:    General: Skin is warm and dry.     Capillary Refill: Capillary refill takes less than 2 seconds.  Neurological:     Mental Status: She is alert.  Psychiatric:        Mood and Affect: Mood normal.     (all labs ordered are  listed, but only abnormal results are displayed) Labs Reviewed  CBC WITH DIFFERENTIAL/PLATELET - Abnormal; Notable for the following components:      Result Value   Hemoglobin 11.8 (*)    All other components within normal limits  COMPREHENSIVE METABOLIC PANEL WITH GFR  BRAIN NATRIURETIC PEPTIDE  D-DIMER, QUANTITATIVE  TROPONIN I (HIGH SENSITIVITY)  TROPONIN I (HIGH SENSITIVITY)    EKG: None  Radiology: CT Chest W Contrast Result Date: 10/16/2023 EXAM: CT CHEST WITH CONTRAST 10/16/2023 07:13:42 PM TECHNIQUE: CT of the chest was performed with the administration of intravenous contrast. Multiplanar reformatted images are provided for review. Automated exposure control, iterative reconstruction, and/or weight based adjustment of the mA/kV was utilized to reduce the radiation dose to as low as reasonably achievable. COMPARISON: CT chest 01/19/2020 and same-day chest radiographs. CLINICAL HISTORY: Abnormal xray - lung nodule, >= 1 cm. FINDINGS: MEDIASTINUM: Heart and pericardium are unremarkable. The central airways are clear. LYMPH NODES: Right hilar lymphadenopathy measuring 2.3 x 2.2 cm is seen on series 3, image 81. LUNGS AND PLEURA: Centrilobular and paraseptal emphysema are present. A cavitary lesion in the left lower lobe measures 4.2 x 4.5 cm. There is adjacent satellite nodularity, bronchiolectasis, and tree-in-bud opacities. An additional cavitary lesion in the right lower lobe measures 4.1 x 3.3 cm. No pleural effusion or pneumothorax is noted. SOFT TISSUES/BONES: No acute fracture or destructive osseous lesion is identified. UPPER ABDOMEN: Limited images of the upper abdomen demonstrates no acute abnormality. IMPRESSION: 1. Cavitary lesions in the left lower lobe (4.2 x 4.5 cm) and right lower lobe (4.1 x 3.3 cm) with adjacent satellite nodularity, bronchiolectasis, and tree-in-bud opacities. This may represent multifocal cavitary pneumonia however malignancy is not excluded. Follow up CT in  8 weeks after treatment is recommended. 2. Right hilar lymphadenopathy measuring 2.3 x 2.2 cm. Continued attention on follow up . 3. Centrilobular and paraseptal emphysema. Electronically signed by: Norman Gatlin MD 10/16/2023 07:30 PM EDT RP Workstation: [id number]   DG Chest 2 View Result Date: 10/16/2023 EXAM: 2 VIEW(S) XRAY OF THE CHEST 10/16/2023 05:43:00 PM COMPARISON: None available. CLINICAL HISTORY: Chest pain; back pain; SHOB; weakness. FINDINGS: LUNGS AND PLEURA: 3.2 cm nodular opacity projects at the lateral margin of the right hilum. Less well-defined 4.3 cm nodular density projects at the inferior left hilum. Possible ill-defined nodular densities peripherally in the mid and lower left lung. No pleural effusion. No pneumothorax. HEART AND MEDIASTINUM: Normal heart size. Atheromatous aorta. BONES AND SOFT TISSUES: Endplate spurring in the mid and lower thoracic spine. No acute osseous abnormality. IMPRESSION: 1. Bilateral pulmonary nodular densities. Recommend CT chest with contrast for further characterization. Electronically signed by:  Katheleen Faes MD 10/16/2023 05:56 PM EDT RP Workstation: [id number]     Procedures   Medications Ordered in the ED  iohexol  (OMNIPAQUE ) 350 MG/ML injection 50 mL (50 mLs Intravenous Contrast Given 10/16/23 1914)                                    Medical Decision Making Amount and/or Complexity of Data Reviewed Labs: ordered. Radiology: ordered.  Risk Prescription drug management.   This patient presents to the ED for concern of SOB, back pain, this involves an extensive number of treatment options, and is a complaint that carries with it a high risk of complications and morbidity.  The differential diagnosis includes ACS, pneumonia, PE, bronchitis, post-viral cough syndrome, pleurisy   Co morbidities that complicate the patient evaluation  Tobacco use history   Lab Tests:  I Ordered, and personally interpreted labs.  The pertinent  results include:   CBC unremarkable, CMP normal, d-dimer negative, BNP negative, troponin unremarkable   Imaging Studies ordered:  I ordered imaging studies including chest xray, CT chest I independently visualized and interpreted imaging which showed Bilateral pulmonary nodular densities. Recommend CT chest with contrast for further characterization. Cavitary lesions in the left lower lobe (4.2 x 4.5 cm) and right lower lobe (4.1 x 3.3 cm) with adjacent satellite nodularity, bronchiolectasis, and tree-in-bud opacities. This may represent multifocal cavitary pneumonia however malignancy is not excluded. Follow up CT in 8 weeks after treatment is recommended. 2. Right hilar lymphadenopathy measuring 2.3 x 2.2 cm. Continued attention on follow up . 3. Centrilobular and paraseptal emphysema. I agree with the radiologist interpretation   Cardiac Monitoring: / EKG:  The patient was maintained on a cardiac monitor.  I personally viewed and interpreted the cardiac monitored which showed an underlying rhythm of: sinus rhythm   Problem List / ED Course / Critical interventions / Medication management  Patient with history of tobacco use presents to the ED with concerns of back pain ongoing for about 2 weeks. States this developed after possible recent respiratory illness and has not fully cleared up. States cough at times is minimally productive. Denies fever, chills, bodyaches, or weight loss.  States that she has had some pain in her chest at times and some of this pain develops but denies any significant feelings of lightheadedness, weakness, or diaphoresis.  No cardiac abnormalities as far she knows.  Denies any other symptoms at this time.  Has not seek medical evaluation for the symptoms up till today. On exam, she has no abnormal heart or lung sounds.  Normal abdominal exam with no area of focal tenderness.  Normal bowel sounds.  Proceed with workup including troponin, BNP, D-dimer. Lab workup is  unremarkable. Dimer is negative. Initial troponin is negative. BNP is negative. CBC and CMP unremarkable. Chest xray concerning for bilateral pulmonary nodules. CT imaging ordered. CT imaging with concerns for bilateral lower lobe cavitary lesions. Doubt TB given lack of consistent symptoms such as night sweats, fever, productive coughing, etc. Patient left AMA before results could be communicated to her. Called pulmonology, Dr, Layman, regarding these findings and they advised if TB is a concern, she would need formal testing before initiating testing. Otherwise, can follow up outpatient. Attempted to contact patient but unsuccessful. Unsecured voicemail. I have reviewed the patients home medicines and have made adjustments as needed   Test / Admission - Considered:  Considered admission but patient left  against medical advice.  Final diagnoses:  Pulmonary cavitary lesion  Acute bilateral thoracic back pain    ED Discharge Orders     None          Joanna Phelps 10/16/23 2359    Patt Alm Macho, MD 10/17/23 [phone]

## 2023-10-16 NOTE — ED Notes (Signed)
 Patient transported to X-ray

## 2023-10-16 NOTE — ED Notes (Signed)
 Patient transported to CT

## 2023-10-16 NOTE — ED Notes (Signed)
 Patient came out of room and stated that her boyfriend is a diabetic at home by himself and hasn't been answering the phone and she has to leave to go check on him.  Patient advised that she needed to stay to complete her work up but stated that she still had to leave.  Risks of leaving AMA including but not limited to injury and death explained to patient and patient verbalized understanding.  Patient CAOx4, left with a steady gait, stated that she didn't have time for d/c vitals.

## 2023-10-16 NOTE — ED Triage Notes (Signed)
 Pt c.o mid back pain and waking up in the middle of the night trying to catch her breath over the past week. Denies chest pain.

## 2023-12-21 ENCOUNTER — Other Ambulatory Visit: Payer: Self-pay | Admitting: Family Medicine

## 2023-12-21 DIAGNOSIS — R918 Other nonspecific abnormal finding of lung field: Secondary | ICD-10-CM

## 2023-12-30 ENCOUNTER — Ambulatory Visit
Admission: RE | Admit: 2023-12-30 | Discharge: 2023-12-30 | Disposition: A | Discharge status: Home / Self Care | Source: Ambulatory Visit | Attending: Family Medicine | Admitting: Family Medicine

## 2023-12-30 DIAGNOSIS — R918 Other nonspecific abnormal finding of lung field: Secondary | ICD-10-CM

## 2023-12-30 MED ORDER — IOPAMIDOL (ISOVUE-370) INJECTION 76%
75.0000 mL | Freq: Once | INTRAVENOUS | Status: AC | PRN
  Administered 2023-12-30: 75 mL via INTRAVENOUS

## 2024-02-07 NOTE — Progress Notes (Signed)
 "  New Patient Pulmonology Office Visit   Subjective:  Patient ID: Joanna Phelps, female    DOB: [birth year]  MRN: [id number]  Referred by: Sebastian Jerilyn HERO, *  CC:  Chief Complaint  Patient presents with   Consult    Mass seen on imaging 10/16. Dry cough x2-37mths.     Discussed the use of AI scribe software for clinical note transcription with the patient, who gave verbal consent to proceed.  History of Present Illness Joanna Phelps is a 63 year old female current smoker, denied pulmonary conditions in the past or chronic inhaler use  who presents for evaluation of lung mass.   She has asthma for about fourteen years but does not use inhalers and has no activity-limiting dyspnea. Fresh grass can trigger mild breathing discomfort, but she can mow lawns without significant symptoms.  She currently smokes about six cigarettes daily, reduced from up to a pack per day. A trial of a cigar-like product worsened her cough and caused hemoptysis, which resolved over a month ago after she stopped using it.  A CT in August after hemoptysis showed 2 cavitary mass in the right and left lung with LAD. A repeat CT in October showed both masses persisted and enlarged. She has no prior cancer, venous clots, or tuberculosis, no traveling outside of the [place] . Denied respiratory symptoms.  She has lost about 55 pounds since March 2025 and reports very high activity without exertional shortness of breath. She has stabbing back pain near the shoulder blade treated with over-the-counter Aleve  and Tylenol , and she reports night sweats.  ROS as above  Allergies: Ibuprofen  Current Outpatient Medications:    amLODipine  (NORVASC ) 10 MG tablet, Take 1 tablet (10 mg total) by mouth daily., Disp: 90 tablet, Rfl: 3   atorvastatin  (LIPITOR) 20 MG tablet, Take 1 tablet (20 mg total) by mouth daily., Disp: 90 tablet, Rfl: 3   azithromycin  (ZITHROMAX ) 250 MG tablet, Take 1 tablet (250 mg total) by mouth daily.  Take first 2 tablets together, then 1 every day until finished., Disp: 6 tablet, Rfl: 0   baclofen  (LIORESAL ) 10 MG tablet, Take 0.5-1 tablets (5-10 mg total) by mouth at bedtime as needed for muscle spasms. (Patient not taking: Reported on 02/04/2021), Disp: 30 each, Rfl: 3   buPROPion  (WELLBUTRIN  SR) 150 MG 12 hr tablet, Take 1 tablet (150 mg total) by mouth 2 (two) times daily. (Patient not taking: Reported on 12/09/2020), Disp: 180 tablet, Rfl: 1   celecoxib  (CELEBREX ) 200 MG capsule, Take 1 capsule (200 mg total) by mouth 2 (two) times daily as needed. (Patient not taking: Reported on 12/09/2020), Disp: 60 capsule, Rfl: 6   diphenhydramine-acetaminophen  (TYLENOL  PM) 25-500 MG TABS tablet, Take 1 tablet by mouth at bedtime as needed (sleep)., Disp: , Rfl:    hydrochlorothiazide  (HYDRODIURIL ) 25 MG tablet, Take 1 tablet (25 mg total) by mouth daily., Disp: 90 tablet, Rfl: 1   loperamide  (IMODIUM ) 2 MG capsule, Take 1 capsule (2 mg total) by mouth 4 (four) times daily as needed for diarrhea or loose stools., Disp: 12 capsule, Rfl: 0   ondansetron  (ZOFRAN -ODT) 4 MG disintegrating tablet, Take 1 tablet (4 mg total) by mouth every 8 (eight) hours as needed., Disp: 20 tablet, Rfl: 0 Past Medical History:  Diagnosis Date   Chlamydia    Hemorrhoids    Past Surgical History:  Procedure Laterality Date   Boil Lanced     BREAST BIOPSY Right 08/05/2023   MM RT  BREAST BX W LOC DEV 1ST LESION IMAGE BX SPEC STEREO GUIDE 08/05/2023 [facility]   BREAST BIOPSY Right 08/05/2023   MM RT BREAST BX W LOC DEV EA AD LESION IMG BX SPEC STEREO GUIDE 08/05/2023 [facility]   Family History  Problem Relation Age of Onset   Diabetes Mother    Hypertension Mother    Hypertension Sister    Diabetes Sister    Hypertension Sister    Hypertension Sister    Hypertension Sister    Hypertension Sister    Social History   Socioeconomic History   Marital status: Single    Spouse name: Not on file   Number  of children: 0   Years of education: Not on file   Highest education level: 11th grade  Occupational History   Not on file  Tobacco Use   Smoking status: Every Day    Types: Cigarettes   Smokeless tobacco: Never   Tobacco comments:    1 cig a week  Vaping Use   Vaping status: Never Used  Substance and Sexual Activity   Alcohol use: No   Drug use: Not Currently    Types: Marijuana   Sexual activity: Yes    Birth control/protection: None  Other Topics Concern   Not on file  Social History Narrative   Not on file   Social Drivers of Health   Financial Resource Strain: Not on file  Food Insecurity: Not on file  Transportation Needs: No Transportation Needs (09/21/2019)   PRAPARE - Administrator, Civil Service (Medical): No    Lack of Transportation (Non-Medical): No  Physical Activity: Not on file  Stress: Not on file  Social Connections: Unknown (07/25/2021)   [facility]   Social Network    Social Network: Not on file  Intimate Partner Violence: Unknown (06/16/2021)   Received from [facility]   HITS    Physically Hurt: Not on file    Insult or Talk Down To: Not on file    Threaten Physical Harm: Not on file    Scream or Curse: Not on file       Objective:  There were no vitals taken for this visit. Wt Readings from Last 3 Encounters:  02/08/24 145 lb 13.9 oz (66.2 kg)  10/16/23 159 lb (72.1 kg)  07/17/23 197 lb 1.5 oz (89.4 kg)   BMI Readings from Last 3 Encounters:  02/08/24 23.54 kg/m  10/16/23 25.66 kg/m  07/17/23 31.81 kg/m   SpO2 Readings from Last 3 Encounters:  02/08/24 98%  10/16/23 100%  07/17/23 100%    Physical Exam Constitutional:      Appearance: Normal appearance.  HENT:     Mouth/Throat:     Mouth: Mucous membranes are moist.  Eyes:     Extraocular Movements: Extraocular movements intact.     Pupils: Pupils are equal, round, and reactive to light.  Cardiovascular:     Rate and Rhythm: Normal rate and  regular rhythm.  Pulmonary:     Effort: Pulmonary effort is normal.     Comments: Decreased on the bases. No wheezing. No crackles Musculoskeletal:        General: Normal range of motion.  Neurological:     General: No focal deficit present.     Mental Status: She is alert and oriented to person, place, and time.    Diagnostic Review:   CT chest 10/2023 1. Cavitary lesions in the left lower lobe (4.2 x 4.5  cm) and right lower lobe (4.1 x 3.3 cm) with adjacent satellite nodularity, bronchiolectasis, and tree-in-bud opacities. This may represent multifocal cavitary pneumonia however malignancy is not excluded. Follow up CT in 8 weeks after treatment is recommended. 2. Right hilar lymphadenopathy measuring 2.3 x 2.2 cm. Continued attention on follow up . 3. Centrilobular and paraseptal emphysema.    CT chest 12/30/23 1. Redemonstration of bilateral lower lobe cavitary masses with associated postobstructive pneumonia/pneumonitis, as described in detail above. No significant interval change. Differential diagnosis include cavitary malignancy versus pneumonia. Consider tissue sampling. 2. Redemonstration of enlarged right hilar lymph node, also grossly similar to the prior study. 3. Multiple other nonacute observations, as described above.  Assessment & Plan:   Assessment & Plan Bilateral pulmonary masses highly suspicious for malignancy Bilateral cavitary pulmonary masses with growth over time, likely malignancy. Associated weight loss and back pain. Unintentional Weight loss of 55 pounds since March. Denied respiratory symptoms or dyspnea on exertion. Present some chills and night sweats. Denied traveling outside of the [place] . - Scheduled bronchoscopy with Dr. Shelah for biopsy of masses and lymph nodes - possible date on 12/9 - Ordered PET scan - Discussed bronchoscopy complications: bleeding, pneumothorax. - Provided educational materials on lung cancer and bronchoscopy. - Patient  understand the risks and the plan for lung biopsy.  Emphysema Nicotine dependence, current smoker Emphysematous changes on CT, likely due to long-term smoking. Current smoker, reduced to six cigarettes a day. I highly recommend smoking cessation.   Unintentional weight loss 55-pound weight loss since March, likely in the setting of malignancy.    Return in about 3 weeks (around 02/29/2024).    Marny Patch, MD Pulmonary and Critical Care Medicine [facility] "

## 2024-02-08 ENCOUNTER — Telehealth: Payer: Self-pay

## 2024-02-08 ENCOUNTER — Ambulatory Visit (INDEPENDENT_AMBULATORY_CARE_PROVIDER_SITE_OTHER)

## 2024-02-08 VITALS — BP 104/60 | HR 66 | Ht 66.0 in | Wt 145.9 lb

## 2024-02-08 DIAGNOSIS — J439 Emphysema, unspecified: Secondary | ICD-10-CM | POA: Diagnosis not present

## 2024-02-08 DIAGNOSIS — R918 Other nonspecific abnormal finding of lung field: Secondary | ICD-10-CM

## 2024-02-08 DIAGNOSIS — F17210 Nicotine dependence, cigarettes, uncomplicated: Secondary | ICD-10-CM | POA: Diagnosis not present

## 2024-02-08 DIAGNOSIS — C3490 Malignant neoplasm of unspecified part of unspecified bronchus or lung: Secondary | ICD-10-CM

## 2024-02-08 DIAGNOSIS — R634 Abnormal weight loss: Secondary | ICD-10-CM

## 2024-02-08 NOTE — Patient Instructions (Signed)
 Dear Ms. Neuroth;  I am concerned your imaging can be related to lung cancer.  I will recommend the following: -PET scan -Pulmonary function test. -Lung biopsy, I will schedule the bronchoscopy procedure soon. I will let you know.

## 2024-02-08 NOTE — Telephone Encounter (Signed)
 Dr. Shelah  I added the patient for concerning of metastatic lung cancer. I think it is likely you can do transbronchial biopsies and EBUS. She has 2 cavitary lesions bilaterally.

## 2024-02-08 NOTE — Telephone Encounter (Signed)
 Please schedule the following:  Provider performing procedure: Dr. Shelah   Diagnosis: metastatic lung cancer Which side if for nodule / mass? Right and left mass with endobronchial biopsies. Procedure: Transbronchial biopsy and EBUS  Has patient been spoken to by Provider and given informed consent? Patient is aware of the procedure. Anesthesia: General Do you need Fluro? Yes Duration of procedure: 30 minmutes Date: 02/22/24, earliest date possible  Alternate Date:  whenever it is possible Time: 9:00 AM Location: [facility] Does patient have OSA? No DM? no Or Latex allergy? Not sure.  Medication Restriction/ Anticoagulate/Antiplatelet: None Pre-op Labs Ordered:determined by Anesthesia Imaging request: PET scan pending and CT scan  from 12/30/23 (If, Super[facility]ension CT Chest, please have STAT courier sent to ENDO)

## 2024-02-09 NOTE — Telephone Encounter (Signed)
 Patient is aware of letter. They are good with time and date. Routing to [employer] for [employer]

## 2024-02-12 NOTE — Telephone Encounter (Signed)
 Thank you :)

## 2024-02-14 NOTE — H&P (View-Only) (Signed)
 "  New Patient Pulmonology Office Visit   Subjective:  Patient ID: Joanna Phelps, female    DOB: [birth year]  MRN: [id number]  Referred by: Sebastian Jerilyn HERO, *  CC:  Chief Complaint  Patient presents with   Follow-up    PFT F/U Pt stated since LOV breathing has been okay Prod cough occurs every now and then (phlegm yellowish)     Discussed the use of AI scribe software for clinical note transcription with the patient, who gave verbal consent to proceed.  History of Present Illness Joanna Phelps is a 63 year old female current smoker, denied pulmonary conditions in the past or chronic inhaler use  who presents for evaluation of lung mass.   She has childhood asthma without long term inhalers. Fresh grass can trigger mild breathing discomfort, but she can mow lawns without significant symptoms.  She currently smokes about 6 cigarettes daily, reduced from up to a pack per day.  A CT in August after hemoptysis showed 2 cavitary mass in the right and left lung with LAD. A repeat CT in October showed both masses persisted and enlarged. Denied respiratory symptoms.  She has lost about 55 pounds since March 2025 and reports very high activity without exertional shortness of breath. She has stabbing back pain near the shoulder blade treated with over-the-counter Aleve  and Tylenol , and she reports night sweats.  Interval history: Patient came after PFTs, which actually looks overall normal without significant obstruction, no restriction, normal DLCO. She has already schedule PET scan and bronchoscopy procedure next week. She does not have any questions about the procedure. She denied any respiratory symptoms currently.  ROS as above  Allergies: Ibuprofen  Current Outpatient Medications:    amLODipine  (NORVASC ) 10 MG tablet, Take 1 tablet (10 mg total) by mouth daily. (Patient not taking: Reported on 02/08/2024), Disp: 90 tablet, Rfl: 3   atorvastatin  (LIPITOR) 20 MG tablet, Take 1  tablet (20 mg total) by mouth daily. (Patient not taking: Reported on 02/08/2024), Disp: 90 tablet, Rfl: 3   azithromycin  (ZITHROMAX ) 250 MG tablet, Take 1 tablet (250 mg total) by mouth daily. Take first 2 tablets together, then 1 every day until finished. (Patient not taking: Reported on 02/08/2024), Disp: 6 tablet, Rfl: 0   baclofen  (LIORESAL ) 10 MG tablet, Take 0.5-1 tablets (5-10 mg total) by mouth at bedtime as needed for muscle spasms. (Patient not taking: Reported on 02/08/2024), Disp: 30 each, Rfl: 3   buPROPion  (WELLBUTRIN  SR) 150 MG 12 hr tablet, Take 1 tablet (150 mg total) by mouth 2 (two) times daily. (Patient not taking: Reported on 02/08/2024), Disp: 180 tablet, Rfl: 1   celecoxib  (CELEBREX ) 200 MG capsule, Take 1 capsule (200 mg total) by mouth 2 (two) times daily as needed. (Patient not taking: Reported on 02/08/2024), Disp: 60 capsule, Rfl: 6   diphenhydramine-acetaminophen  (TYLENOL  PM) 25-500 MG TABS tablet, Take 1 tablet by mouth at bedtime as needed (sleep)., Disp: , Rfl:    hydrochlorothiazide  (HYDRODIURIL ) 25 MG tablet, Take 1 tablet (25 mg total) by mouth daily. (Patient not taking: Reported on 02/08/2024), Disp: 90 tablet, Rfl: 1   loperamide  (IMODIUM ) 2 MG capsule, Take 1 capsule (2 mg total) by mouth 4 (four) times daily as needed for diarrhea or loose stools. (Patient not taking: Reported on 02/08/2024), Disp: 12 capsule, Rfl: 0   ondansetron  (ZOFRAN -ODT) 4 MG disintegrating tablet, Take 1 tablet (4 mg total) by mouth every 8 (eight) hours as needed. (Patient not taking: Reported on  02/08/2024), Disp: 20 tablet, Rfl: 0   traZODone (DESYREL) 100 MG tablet, Take 100 mg by mouth at bedtime., Disp: , Rfl:    VENTOLIN HFA 108 (90 Base) MCG/ACT inhaler, Inhale 2 puffs into the lungs every 4 (four) hours as needed., Disp: , Rfl:  Past Medical History:  Diagnosis Date   Chlamydia    Hemorrhoids    Past Surgical History:  Procedure Laterality Date   Boil Lanced     BREAST  BIOPSY Right 08/05/2023   MM RT BREAST BX W LOC DEV 1ST LESION IMAGE BX SPEC STEREO GUIDE 08/05/2023 [facility]   BREAST BIOPSY Right 08/05/2023   MM RT BREAST BX W LOC DEV EA AD LESION IMG BX SPEC STEREO GUIDE 08/05/2023 [facility]   Family History  Problem Relation Age of Onset   Diabetes Mother    Hypertension Mother    Hypertension Sister    Diabetes Sister    Hypertension Sister    Hypertension Sister    Hypertension Sister    Hypertension Sister    Social History   Socioeconomic History   Marital status: Single    Spouse name: Not on file   Number of children: 0   Years of education: Not on file   Highest education level: 11th grade  Occupational History   Not on file  Tobacco Use   Smoking status: Every Day    Types: Cigarettes   Smokeless tobacco: Never   Tobacco comments:    6 cig per day 11/25  Vaping Use   Vaping status: Never Used  Substance and Sexual Activity   Alcohol use: No   Drug use: Not Currently    Types: Marijuana   Sexual activity: Yes    Birth control/protection: None  Other Topics Concern   Not on file  Social History Narrative   Not on file   Social Drivers of Health   Financial Resource Strain: Not on file  Food Insecurity: Not on file  Transportation Needs: No Transportation Needs (09/21/2019)   PRAPARE - Administrator, Civil Service (Medical): No    Lack of Transportation (Non-Medical): No  Physical Activity: Not on file  Stress: Not on file  Social Connections: Unknown (07/25/2021)   Received from [facility]   Social Network    Social Network: Not on file  Intimate Partner Violence: Unknown (06/16/2021)   Received from [facility]   HITS    Physically Hurt: Not on file    Insult or Talk Down To: Not on file    Threaten Physical Harm: Not on file    Scream or Curse: Not on file       Objective:  There were no vitals taken for this visit. Wt Readings from Last 3 Encounters:  02/08/24 145 lb  13.9 oz (66.2 kg)  10/16/23 159 lb (72.1 kg)  07/17/23 197 lb 1.5 oz (89.4 kg)   BMI Readings from Last 3 Encounters:  02/08/24 23.54 kg/m  10/16/23 25.66 kg/m  07/17/23 31.81 kg/m   SpO2 Readings from Last 3 Encounters:  02/08/24 98%  10/16/23 100%  07/17/23 100%    Physical Exam Constitutional:      Appearance: Normal appearance.  HENT:     Mouth/Throat:     Mouth: Mucous membranes are moist.  Eyes:     Extraocular Movements: Extraocular movements intact.     Pupils: Pupils are equal, round, and reactive to light.  Cardiovascular:     Rate and Rhythm: Normal  rate and regular rhythm.  Pulmonary:     Effort: Pulmonary effort is normal.     Comments: Decreased on the bases. No wheezing. No crackles Musculoskeletal:        General: Normal range of motion.  Neurological:     General: No focal deficit present.     Mental Status: She is alert and oriented to person, place, and time.    Diagnostic Review:   CT chest 10/2023 1. Cavitary lesions in the left lower lobe (4.2 x 4.5 cm) and right lower lobe (4.1 x 3.3 cm) with adjacent satellite nodularity, bronchiolectasis, and tree-in-bud opacities. This may represent multifocal cavitary pneumonia however malignancy is not excluded. Follow up CT in 8 weeks after treatment is recommended. 2. Right hilar lymphadenopathy measuring 2.3 x 2.2 cm. Continued attention on follow up . 3. Centrilobular and paraseptal emphysema.    CT chest 12/30/23 1. Redemonstration of bilateral lower lobe cavitary masses with associated postobstructive pneumonia/pneumonitis, as described in detail above. No significant interval change. Differential diagnosis include cavitary malignancy versus pneumonia. Consider tissue sampling. 2. Redemonstration of enlarged right hilar lymph node, also grossly similar to the prior study. 3. Multiple other nonacute observations, as described above.  Assessment & Plan:   Assessment & Plan Bilateral  pulmonary masses highly suspicious for malignancy Bilateral cavitary pulmonary masses with growth over time, likely malignancy. Associated weight loss and back pain. Unintentional Weight loss of 55 pounds since March. Denied respiratory symptoms or dyspnea on exertion. Present some chills and night sweats. Denied traveling outside of the [place] . Her PFTs today are normal, no obstruction, normal DLCO.  - Scheduled bronchoscopy with Dr. Shelah for biopsy of masses and lymph nodes - on 12/9 - PET scan on 12/8 - Patient does not have questions about the procedure.  Emphysema Nicotine dependence, current smoker Emphysematous changes on CT, likely due to long-term smoking. Current smoker, reduced to six cigarettes a day. I highly recommend smoking cessation.   Unintentional weight loss 55-pound weight loss since March, likely in the setting of malignancy.    Return in about 2 months (around 04/17/2024).    Marny Patch, MD Pulmonary and Critical Care Medicine [facility] "

## 2024-02-14 NOTE — Progress Notes (Signed)
 New Patient Pulmonology Office Visit   Subjective:  Patient ID: Joanna Phelps, female    DOB: 21-Jan-1961  MRN: [id number]  Referred by: Sebastian Jerilyn HERO, *  CC:  No chief complaint on file.   Discussed the use of AI scribe software for clinical note transcription with the patient, who gave verbal consent to proceed.  History of Present Illness Joanna Phelps is a 63 year old female current smoker, denied pulmonary conditions in the past or chronic inhaler use  who presents for evaluation of lung mass.   She has asthma for about fourteen years but does not use inhalers and has no activity-limiting dyspnea. Fresh grass can trigger mild breathing discomfort, but she can mow lawns without significant symptoms.  She currently smokes about six cigarettes daily, reduced from up to a pack per day. A trial of a cigar-like product worsened her cough and caused hemoptysis, which resolved over a month ago after she stopped using it.  A CT in August after hemoptysis showed 2 cavitary mass in the right and left lung with LAD. A repeat CT in October showed both masses persisted and enlarged. She has no prior cancer, venous clots, or tuberculosis, no traveling outside of the [place] . Denied respiratory symptoms.  She has lost about 55 pounds since March 2025 and reports very high activity without exertional shortness of breath. She has stabbing back pain near the shoulder blade treated with over-the-counter Aleve  and Tylenol , and she reports night sweats.  ROS as above  Allergies: Ibuprofen  Current Outpatient Medications:    amLODipine  (NORVASC ) 10 MG tablet, Take 1 tablet (10 mg total) by mouth daily. (Patient not taking: Reported on 02/08/2024), Disp: 90 tablet, Rfl: 3   atorvastatin  (LIPITOR) 20 MG tablet, Take 1 tablet (20 mg total) by mouth daily. (Patient not taking: Reported on 02/08/2024), Disp: 90 tablet, Rfl: 3   azithromycin  (ZITHROMAX ) 250 MG tablet, Take 1 tablet (250 mg total) by  mouth daily. Take first 2 tablets together, then 1 every day until finished. (Patient not taking: Reported on 02/08/2024), Disp: 6 tablet, Rfl: 0   baclofen  (LIORESAL ) 10 MG tablet, Take 0.5-1 tablets (5-10 mg total) by mouth at bedtime as needed for muscle spasms. (Patient not taking: Reported on 02/08/2024), Disp: 30 each, Rfl: 3   buPROPion  (WELLBUTRIN  SR) 150 MG 12 hr tablet, Take 1 tablet (150 mg total) by mouth 2 (two) times daily. (Patient not taking: Reported on 02/08/2024), Disp: 180 tablet, Rfl: 1   celecoxib  (CELEBREX ) 200 MG capsule, Take 1 capsule (200 mg total) by mouth 2 (two) times daily as needed. (Patient not taking: Reported on 02/08/2024), Disp: 60 capsule, Rfl: 6   diphenhydramine-acetaminophen  (TYLENOL  PM) 25-500 MG TABS tablet, Take 1 tablet by mouth at bedtime as needed (sleep)., Disp: , Rfl:    hydrochlorothiazide  (HYDRODIURIL ) 25 MG tablet, Take 1 tablet (25 mg total) by mouth daily. (Patient not taking: Reported on 02/08/2024), Disp: 90 tablet, Rfl: 1   loperamide  (IMODIUM ) 2 MG capsule, Take 1 capsule (2 mg total) by mouth 4 (four) times daily as needed for diarrhea or loose stools. (Patient not taking: Reported on 02/08/2024), Disp: 12 capsule, Rfl: 0   ondansetron  (ZOFRAN -ODT) 4 MG disintegrating tablet, Take 1 tablet (4 mg total) by mouth every 8 (eight) hours as needed. (Patient not taking: Reported on 02/08/2024), Disp: 20 tablet, Rfl: 0   traZODone (DESYREL) 100 MG tablet, Take 100 mg by mouth at bedtime., Disp: , Rfl:    VENTOLIN  HFA 108 (90 Base) MCG/ACT inhaler, Inhale 2 puffs into the lungs every 4 (four) hours as needed., Disp: , Rfl:  Past Medical History:  Diagnosis Date   Chlamydia    Hemorrhoids    Past Surgical History:  Procedure Laterality Date   Boil Lanced     BREAST BIOPSY Right 08/05/2023   MM RT BREAST BX W LOC DEV 1ST LESION IMAGE BX SPEC STEREO GUIDE 08/05/2023 [facility]   BREAST BIOPSY Right 08/05/2023   MM RT BREAST BX W LOC DEV EA AD  LESION IMG BX SPEC STEREO GUIDE 08/05/2023 [facility]   Family History  Problem Relation Age of Onset   Diabetes Mother    Hypertension Mother    Hypertension Sister    Diabetes Sister    Hypertension Sister    Hypertension Sister    Hypertension Sister    Hypertension Sister    Social History   Socioeconomic History   Marital status: Single    Spouse name: Not on file   Number of children: 0   Years of education: Not on file   Highest education level: 11th grade  Occupational History   Not on file  Tobacco Use   Smoking status: Every Day    Types: Cigarettes   Smokeless tobacco: Never   Tobacco comments:    6 cig per day 11/25  Vaping Use   Vaping status: Never Used  Substance and Sexual Activity   Alcohol use: No   Drug use: Not Currently    Types: Marijuana   Sexual activity: Yes    Birth control/protection: None  Other Topics Concern   Not on file  Social History Narrative   Not on file   Social Drivers of Health   Financial Resource Strain: Not on file  Food Insecurity: Not on file  Transportation Needs: No Transportation Needs (09/21/2019)   PRAPARE - Administrator, Civil Service (Medical): No    Lack of Transportation (Non-Medical): No  Physical Activity: Not on file  Stress: Not on file  Social Connections: Unknown (07/25/2021)   Received from [facility]   Social Network    Social Network: Not on file  Intimate Partner Violence: Unknown (06/16/2021)   Received from [facility]   HITS    Physically Hurt: Not on file    Insult or Talk Down To: Not on file    Threaten Physical Harm: Not on file    Scream or Curse: Not on file       Objective:  There were no vitals taken for this visit. Wt Readings from Last 3 Encounters:  02/08/24 145 lb 13.9 oz (66.2 kg)  10/16/23 159 lb (72.1 kg)  07/17/23 197 lb 1.5 oz (89.4 kg)   BMI Readings from Last 3 Encounters:  02/08/24 23.54 kg/m  10/16/23 25.66 kg/m  07/17/23 31.81 kg/m    SpO2 Readings from Last 3 Encounters:  02/08/24 98%  10/16/23 100%  07/17/23 100%    Physical Exam Constitutional:      Appearance: Normal appearance.  HENT:     Mouth/Throat:     Mouth: Mucous membranes are moist.  Eyes:     Extraocular Movements: Extraocular movements intact.     Pupils: Pupils are equal, round, and reactive to light.  Cardiovascular:     Rate and Rhythm: Normal rate and regular rhythm.  Pulmonary:     Effort: Pulmonary effort is normal.     Comments: Decreased on the bases. No wheezing. No  crackles Musculoskeletal:        General: Normal range of motion.  Neurological:     General: No focal deficit present.     Mental Status: She is alert and oriented to person, place, and time.    Diagnostic Review:   CT chest 10/2023 1. Cavitary lesions in the left lower lobe (4.2 x 4.5 cm) and right lower lobe (4.1 x 3.3 cm) with adjacent satellite nodularity, bronchiolectasis, and tree-in-bud opacities. This may represent multifocal cavitary pneumonia however malignancy is not excluded. Follow up CT in 8 weeks after treatment is recommended. 2. Right hilar lymphadenopathy measuring 2.3 x 2.2 cm. Continued attention on follow up . 3. Centrilobular and paraseptal emphysema.    CT chest 12/30/23 1. Redemonstration of bilateral lower lobe cavitary masses with associated postobstructive pneumonia/pneumonitis, as described in detail above. No significant interval change. Differential diagnosis include cavitary malignancy versus pneumonia. Consider tissue sampling. 2. Redemonstration of enlarged right hilar lymph node, also grossly similar to the prior study. 3. Multiple other nonacute observations, as described above.  Assessment & Plan:   Assessment & Plan Bilateral pulmonary masses highly suspicious for malignancy Bilateral cavitary pulmonary masses with growth over time, likely malignancy. Associated weight loss and back pain. Unintentional Weight loss of  55 pounds since March. Denied respiratory symptoms or dyspnea on exertion. Present some chills and night sweats. Denied traveling outside of the [place] . - Scheduled bronchoscopy with Dr. Shelah for biopsy of masses and lymph nodes - possible date on 12/9 - Ordered PET scan - Discussed bronchoscopy complications: bleeding, pneumothorax. - Provided educational materials on lung cancer and bronchoscopy. - Patient understand the risks and the plan for lung biopsy.  Emphysema Nicotine dependence, current smoker Emphysematous changes on CT, likely due to long-term smoking. Current smoker, reduced to six cigarettes a day. I highly recommend smoking cessation.   Unintentional weight loss 55-pound weight loss since March, likely in the setting of malignancy.    No follow-ups on file.    Marny Patch, MD Pulmonary and Critical Care Medicine [facility]

## 2024-02-15 ENCOUNTER — Ambulatory Visit

## 2024-02-15 VITALS — BP 122/78 | HR 68 | Temp 98.2°F | Ht 67.0 in | Wt 145.0 lb

## 2024-02-15 DIAGNOSIS — R634 Abnormal weight loss: Secondary | ICD-10-CM

## 2024-02-15 DIAGNOSIS — C3490 Malignant neoplasm of unspecified part of unspecified bronchus or lung: Secondary | ICD-10-CM

## 2024-02-15 DIAGNOSIS — J439 Emphysema, unspecified: Secondary | ICD-10-CM | POA: Diagnosis not present

## 2024-02-15 DIAGNOSIS — F17210 Nicotine dependence, cigarettes, uncomplicated: Secondary | ICD-10-CM

## 2024-02-15 LAB — PULMONARY FUNCTION TEST
DL/VA % pred: 106 %
DL/VA: 4.4 ml/min/mmHg/L
DLCO cor % pred: 91 %
DLCO cor: 20.27 ml/min/mmHg
DLCO unc % pred: 91 %
DLCO unc: 20.27 ml/min/mmHg
FEF 25-75 Post: 2.34 L/s
FEF 25-75 Pre: 1.54 L/s
FEF2575-%Change-Post: 51 %
FEF2575-%Pred-Post: 94 %
FEF2575-%Pred-Pre: 62 %
FEV1-%Change-Post: 8 %
FEV1-%Pred-Post: 90 %
FEV1-%Pred-Pre: 82 %
FEV1-Post: 2.54 L
FEV1-Pre: 2.34 L
FEV1FVC-%Change-Post: 4 %
FEV1FVC-%Pred-Pre: 91 %
FEV6-%Change-Post: 4 %
FEV6-%Pred-Post: 95 %
FEV6-%Pred-Pre: 91 %
FEV6-Post: 3.37 L
FEV6-Pre: 3.23 L
FEV6FVC-%Change-Post: 0 %
FEV6FVC-%Pred-Post: 102 %
FEV6FVC-%Pred-Pre: 102 %
FVC-%Change-Post: 4 %
FVC-%Pred-Post: 92 %
FVC-%Pred-Pre: 89 %
FVC-Post: 3.4 L
FVC-Pre: 3.26 L
Post FEV1/FVC ratio: 75 %
Post FEV6/FVC ratio: 99 %
Pre FEV1/FVC ratio: 72 %
Pre FEV6/FVC Ratio: 99 %
RV % pred: 86 %
RV: 1.87 L
TLC % pred: 92 %
TLC: 5.1 L

## 2024-02-15 NOTE — Patient Instructions (Signed)
 Full PFT performed today.

## 2024-02-15 NOTE — Progress Notes (Signed)
 Full PFT performed today.

## 2024-02-18 ENCOUNTER — Encounter (HOSPITAL_COMMUNITY): Payer: Self-pay | Admitting: Emergency Medicine

## 2024-02-18 ENCOUNTER — Other Ambulatory Visit: Payer: Self-pay

## 2024-02-18 ENCOUNTER — Other Ambulatory Visit: Payer: Self-pay | Admitting: Emergency Medicine

## 2024-02-18 DIAGNOSIS — C3430 Malignant neoplasm of lower lobe, unspecified bronchus or lung: Secondary | ICD-10-CM

## 2024-02-18 NOTE — Pre-Procedure Instructions (Signed)
-------------    SDW INSTRUCTIONS given:  Your procedure is scheduled on 12/9.  Report to [facility] Main Entrance A at 8:30 A.M., and check in at the Admitting office.  Any questions or running late day of surgery: call [phone]    Remember:  Do not eat or drink after midnight the night before your surgery    Take these medicines the morning of surgery with A SIP OF WATER   May take these medicines IF NEEDED: VENTOLIN HFA 108 (90 Base) inhaler  As of today, STOP taking any Aspirin (unless otherwise instructed by your surgeon) Aleve , Naproxen , Ibuprofen, Motrin, Advil, Goody's, BC's, all herbal medications, fish oil, and all vitamins.   Do NOT Smoke (Tobacco/Vaping) 24 hours prior to your procedure  If you use a CPAP at night, you may bring all equipment for your overnight stay.     You will be asked to remove any contacts, glasses, piercing's, hearing aid's, dentures/partials prior to surgery. Please bring cases for these items if needed.     Patients discharged the day of surgery will not be allowed to drive home, and someone needs to stay with them for 24 hours.  SURGICAL WAITING ROOM VISITATION Patients may have no more than 2 support people in the waiting area - these visitors may rotate.   Pre-op nurse will coordinate an appropriate time for 1 ADULT support person, who may not rotate, to accompany patient in pre-op.  Children under the age of 51 must have an adult with them who is not the patient and must remain in the main waiting area with an adult.  If the patient needs to stay at the hospital during part of their recovery, the visitor guidelines for inpatient rooms apply.  Please refer to the [facility] website for the visitor guidelines for any additional information.   Special instructions:   [place]- Preparing For Surgery   Please follow these instructions carefully.   Shower the NIGHT BEFORE SURGERY and the MORNING OF SURGERY with DIAL Soap.   Pat  yourself dry with a CLEAN TOWEL.  Wear CLEAN PAJAMAS to bed the night before surgery  Place CLEAN SHEETS on your bed the night of your first shower and DO NOT SLEEP WITH PETS.   Additional instructions for the day of surgery: DO NOT APPLY any lotions, deodorants, cologne, or perfumes.   Do not wear jewelry or makeup Do not wear nail polish, gel polish, artificial nails, or any other type of covering on natural nails (fingers and toes) Do not bring valuables to the hospital. [facility] is not responsible for valuables/personal belongings. Put on clean/comfortable clothes.  Please brush your teeth.  Ask your nurse before applying any prescription medications to the skin.

## 2024-02-18 NOTE — Progress Notes (Signed)
 PCP - Jerilyn Hummer, FNP Cardiologist - denies  PPM/ICD - denies   Chest x-ray - 10/16/23 EKG - 10/16/23 Stress Test - denies ECHO - denies Cardiac Cath - denies  CPAP - denies  DM- denies  ASA/Blood Thinner Instructions: n/a   ERAS Protcol - no, NPO  COVID TEST- n/a  Anesthesia review: no  Patient verbally denies any shortness of breath, fever, cough and chest pain during phone call     Questions were answered. Patient verbalized understanding of instructions.

## 2024-02-21 ENCOUNTER — Encounter (HOSPITAL_COMMUNITY): Admission: RE | Admit: 2024-02-21 | Discharge: 2024-02-21

## 2024-02-21 DIAGNOSIS — C3490 Malignant neoplasm of unspecified part of unspecified bronchus or lung: Secondary | ICD-10-CM

## 2024-02-21 LAB — GLUCOSE, CAPILLARY: Glucose-Capillary: 90 mg/dL (ref 70–99)

## 2024-02-21 MED ORDER — FLUDEOXYGLUCOSE F - 18 (FDG) INJECTION
7.2000 | Freq: Once | INTRAVENOUS | Status: AC
  Administered 2024-02-21: 7.07 via INTRAVENOUS

## 2024-02-22 ENCOUNTER — Ambulatory Visit (HOSPITAL_COMMUNITY): Admission: RE | Admit: 2024-02-22 | Source: Home / Self Care | Admitting: Emergency Medicine

## 2024-02-22 DIAGNOSIS — C3490 Malignant neoplasm of unspecified part of unspecified bronchus or lung: Secondary | ICD-10-CM | POA: Insufficient documentation

## 2024-02-22 SURGERY — VIDEO BRONCHOSCOPY WITH ENDOBRONCHIAL NAVIGATION
Anesthesia: General | Laterality: Bilateral

## 2024-02-22 SURGERY — VIDEO BRONCHOSCOPY WITH ENDOBRONCHIAL NAVIGATION
Anesthesia: General

## 2024-02-22 NOTE — Telephone Encounter (Signed)
 Patient wants to cancel Bronch for today. She said no will be able to take her to surgery.   Patient has been rescheduled to 03/06/24 at 9:10am . She is aware of the time and day and is good with it. Routing to [employer] for Auth.  [id number] is case number

## 2024-02-26 ENCOUNTER — Ambulatory Visit: Payer: Self-pay

## 2024-03-02 ENCOUNTER — Other Ambulatory Visit: Payer: Self-pay

## 2024-03-02 NOTE — Progress Notes (Signed)
 Patient is updated on arrival time and procedure instructions.  Patient stated that approximately 3 days ago she started with cold like symptoms - head and nasal congestion, productive cough, earache, tired and weakness.  Lynwood Hope, PA was made aware and a message has been left at Dr. Lanny office.

## 2024-03-03 ENCOUNTER — Telehealth: Payer: Self-pay

## 2024-03-03 DIAGNOSIS — J441 Chronic obstructive pulmonary disease with (acute) exacerbation: Secondary | ICD-10-CM

## 2024-03-03 MED ORDER — DOXYCYCLINE HYCLATE 100 MG PO TABS: 100.0000 mg | ORAL_TABLET | Freq: Two times a day (BID) | ORAL | 0 refills | Status: DC

## 2024-03-03 MED ORDER — PREDNISONE 10 MG PO TABS: ORAL_TABLET | ORAL | 0 refills | Status: DC

## 2024-03-03 NOTE — Telephone Encounter (Signed)
 I tried calling the pt x1 and there was no answer. LMTCB regards rxs sent to pharmacy. Paging Dr. Shelah now.

## 2024-03-03 NOTE — Telephone Encounter (Signed)
 I spoke with Dr. Shelah and he is going to look at the pts chart and decide.

## 2024-03-03 NOTE — Telephone Encounter (Signed)
 Routing to DOD to see what Dr. Geronimo suggest.

## 2024-03-03 NOTE — Telephone Encounter (Signed)
 Copied from CRM #[id number]. Topic: Clinical - Medical Advice >> Mar 02, 2024  4:02 PM Russell PARAS wrote: Reason for CRM:   Morna, with [facility] Pre Admissions, is contacting clinic regarding her discussion with pt when going over pre procedure instructions. Procedure is scheduled for 12/22  Pt reports she has had a head cold for several day with symptoms of:  Productive cough Head congestion Nasal congestion Ear pain  Morna reports that an Systems Developer will contact pt after speaking with Anesthesia. She reports typical guidelines with these symptoms is to delay procedure 2-4 weeks; however pt's case is urgent.   Wanted to make Byrum aware of situation and he can contact her with any questions  CB#  [phone]  OR  [phone]   Called and spoke with the pt and pt states she took alka seltzer last night. pt states she has body aches and head congestion including runny nose. Pt also states she has diarrhea and symptoms started 3-4 days ago. Productive coughing with tan/yellow phlegm.  Dr. Shelah and Dr. Adrien are off today. Routing to both and sent a secure chat to Dr. Shelah I will page Dr. Shelah within the next few hours if no answer to secure chat.

## 2024-03-03 NOTE — Telephone Encounter (Signed)
 Call in    Take doxycycline  100mg  po twice daily x 5 days; take after meals and avoid sunlight  Please take prednisone  40 mg x1 day, then 30 mg x1 day, then 20 mg x1 day, then 10 mg x1 day, and then 5 mg x1 day and stop  Keep schedule for bronch 03/06/24 but routing message to [facility] NOW and he can decide on 03/06/24 in Pre op if he wants to proceed or not  If she gets worse - go to ER  Allergies[1]     [1]  Allergies Allergen Reactions   Ibuprofen Itching, Rash and Hives

## 2024-03-03 NOTE — Telephone Encounter (Signed)
 I spoke with the patient by phone this morning.  She is experiencing viral symptoms as described, cough, sputum production.  No wheezing.  I asked her to take the doxycycline  as prescribed, hold off on the prednisone  for now unless shortness of breath or wheezing evolve.  Ordinarily we would postpone an elective procedure, but I am going to leave it on the schedule because of the urgency in getting a tissue diagnosis on her bilateral upper lobe mass lesions.  We will touch base on Monday morning and see how she is doing.  Hopefully there will be some interval improvement.  If we do not believe she can undergo the procedure safely then I will certainly postpone.

## 2024-03-06 ENCOUNTER — Ambulatory Visit (HOSPITAL_COMMUNITY): Payer: Self-pay | Admitting: Physician Assistant

## 2024-03-06 ENCOUNTER — Ambulatory Visit (HOSPITAL_COMMUNITY)

## 2024-03-06 ENCOUNTER — Ambulatory Visit (HOSPITAL_COMMUNITY)
Admission: RE | Admit: 2024-03-06 | Discharge: 2024-03-06 | Disposition: A | Discharge status: Home / Self Care | Attending: Emergency Medicine | Admitting: Emergency Medicine

## 2024-03-06 ENCOUNTER — Encounter (HOSPITAL_COMMUNITY): Admission: RE | Disposition: A | Payer: Self-pay | Attending: Emergency Medicine

## 2024-03-06 ENCOUNTER — Other Ambulatory Visit: Payer: Self-pay | Admitting: Emergency Medicine

## 2024-03-06 ENCOUNTER — Other Ambulatory Visit: Payer: Self-pay

## 2024-03-06 ENCOUNTER — Encounter (HOSPITAL_COMMUNITY): Payer: Self-pay | Admitting: Emergency Medicine

## 2024-03-06 DIAGNOSIS — C3432 Malignant neoplasm of lower lobe, left bronchus or lung: Secondary | ICD-10-CM | POA: Insufficient documentation

## 2024-03-06 DIAGNOSIS — D649 Anemia, unspecified: Secondary | ICD-10-CM | POA: Insufficient documentation

## 2024-03-06 DIAGNOSIS — C3430 Malignant neoplasm of lower lobe, unspecified bronchus or lung: Secondary | ICD-10-CM

## 2024-03-06 DIAGNOSIS — C3431 Malignant neoplasm of lower lobe, right bronchus or lung: Secondary | ICD-10-CM | POA: Insufficient documentation

## 2024-03-06 DIAGNOSIS — R590 Localized enlarged lymph nodes: Secondary | ICD-10-CM

## 2024-03-06 DIAGNOSIS — R918 Other nonspecific abnormal finding of lung field: Secondary | ICD-10-CM | POA: Diagnosis present

## 2024-03-06 DIAGNOSIS — J432 Centrilobular emphysema: Secondary | ICD-10-CM | POA: Diagnosis not present

## 2024-03-06 DIAGNOSIS — C3490 Malignant neoplasm of unspecified part of unspecified bronchus or lung: Secondary | ICD-10-CM

## 2024-03-06 DIAGNOSIS — F17210 Nicotine dependence, cigarettes, uncomplicated: Secondary | ICD-10-CM | POA: Diagnosis not present

## 2024-03-06 HISTORY — PX: BRONCHIAL BIOPSY: SHX5109

## 2024-03-06 HISTORY — PX: BRONCHIAL NEEDLE ASPIRATION BIOPSY: SHX5106

## 2024-03-06 HISTORY — PX: VIDEO BRONCHOSCOPY WITH ENDOBRONCHIAL NAVIGATION: SHX6175

## 2024-03-06 LAB — BASIC METABOLIC PANEL WITH GFR
Anion gap: 10 (ref 5–15)
BUN: 13 mg/dL (ref 8–23)
CO2: 25 mmol/L (ref 22–32)
Calcium: 9.1 mg/dL (ref 8.9–10.3)
Chloride: 103 mmol/L (ref 98–111)
Creatinine, Ser: 0.67 mg/dL (ref 0.44–1.00)
GFR, Estimated: >60 mL/min
Glucose, Bld: 86 mg/dL (ref 70–99)
Potassium: 4.2 mmol/L (ref 3.5–5.1)
Sodium: 139 mmol/L (ref 135–145)

## 2024-03-06 LAB — CBC
HCT: 36.4 % (ref 36.0–46.0)
Hemoglobin: 11.7 g/dL — ABNORMAL LOW (ref 12.0–15.0)
MCH: 27.5 pg (ref 26.0–34.0)
MCHC: 32.1 g/dL (ref 30.0–36.0)
MCV: 85.4 fL (ref 80.0–100.0)
Platelets: 329 K/uL (ref 150–400)
RBC: 4.26 MIL/uL (ref 3.87–5.11)
RDW: 14.8 % (ref 11.5–15.5)
WBC: 4.7 K/uL (ref 4.0–10.5)
nRBC: 0 % (ref 0.0–0.2)

## 2024-03-06 SURGERY — VIDEO BRONCHOSCOPY WITH ENDOBRONCHIAL NAVIGATION
Anesthesia: General

## 2024-03-06 MED ORDER — CHLORHEXIDINE GLUCONATE 0.12 % MT SOLN: 15.0000 mL | Freq: Once | OROMUCOSAL | Status: DC

## 2024-03-06 MED ORDER — EPHEDRINE SULFATE-NACL 50-0.9 MG/10ML-% IV SOSY
PREFILLED_SYRINGE | INTRAVENOUS | Status: DC | PRN
  Administered 2024-03-06: 10 mg via INTRAVENOUS
  Administered 2024-03-06: 5 mg via INTRAVENOUS

## 2024-03-06 MED ORDER — SUCCINYLCHOLINE CHLORIDE 200 MG/10ML IV SOSY
PREFILLED_SYRINGE | INTRAVENOUS | Status: DC | PRN
  Administered 2024-03-06: 160 mg via INTRAVENOUS

## 2024-03-06 MED ORDER — PROPOFOL 10 MG/ML IV BOLUS
INTRAVENOUS | Status: DC | PRN
  Administered 2024-03-06: 40 mg via INTRAVENOUS
  Administered 2024-03-06: 160 mg via INTRAVENOUS

## 2024-03-06 MED ORDER — FENTANYL CITRATE (PF) 100 MCG/2ML IJ SOLN
INTRAMUSCULAR | Status: AC
  Filled 2024-03-06: qty 2

## 2024-03-06 MED ORDER — MIDAZOLAM HCL 2 MG/2ML IJ SOLN
INTRAMUSCULAR | Status: AC
  Filled 2024-03-06: qty 2

## 2024-03-06 MED ORDER — SUGAMMADEX SODIUM 200 MG/2ML IV SOLN
INTRAVENOUS | Status: DC | PRN
  Administered 2024-03-06: 200 mg via INTRAVENOUS

## 2024-03-06 MED ORDER — ONDANSETRON HCL 4 MG/2ML IJ SOLN
INTRAMUSCULAR | Status: DC | PRN
  Administered 2024-03-06: 4 mg via INTRAVENOUS

## 2024-03-06 MED ORDER — CHLORHEXIDINE GLUCONATE 0.12 % MT SOLN
OROMUCOSAL | Status: AC
  Filled 2024-03-06: qty 15

## 2024-03-06 MED ORDER — MIDAZOLAM HCL (PF) 2 MG/2ML IJ SOLN
INTRAMUSCULAR | Status: DC | PRN
  Administered 2024-03-06: 2 mg via INTRAVENOUS

## 2024-03-06 MED ORDER — LIDOCAINE HCL (CARDIAC) PF 100 MG/5ML IV SOSY
PREFILLED_SYRINGE | INTRAVENOUS | Status: DC | PRN
  Administered 2024-03-06: 100 mg via INTRAVENOUS

## 2024-03-06 MED ORDER — DEXAMETHASONE SOD PHOSPHATE PF 10 MG/ML IJ SOLN
INTRAMUSCULAR | Status: DC | PRN
  Administered 2024-03-06: 10 mg via INTRAVENOUS

## 2024-03-06 MED ORDER — LACTATED RINGERS IV SOLN: INTRAVENOUS | Status: DC

## 2024-03-06 MED ORDER — ROCURONIUM BROMIDE 10 MG/ML (PF) SYRINGE
PREFILLED_SYRINGE | INTRAVENOUS | Status: DC | PRN
  Administered 2024-03-06: 40 mg via INTRAVENOUS

## 2024-03-06 MED ADMIN — henylephrine-NaCl Pref Syr 0.8 MG/10ML-0.9% (80 MCG/ML): 80 ug | INTRAVENOUS | NDC 65302050510

## 2024-03-06 MED ADMIN — Propofol IV Emul 500 MG/50ML (10 MG/ML): 50 ug/kg/min | INTRAVENOUS | NDC 00069023420

## 2024-03-06 MED ADMIN — Fentanyl Citrate Preservative Free (PF) Inj 100 MCG/2ML: 100 ug | INTRAVENOUS | NDC 72572017001

## 2024-03-06 SURGICAL SUPPLY — 36 items
ADAPTER BRONCHOSCOPE OLYMPUS (ADAPTER) ×3 IMPLANT
ADAPTER VALVE BIOPSY EBUS (MISCELLANEOUS) IMPLANT
BAG COUNTER SPONGE SURGICOUNT (BAG) ×3 IMPLANT
BRUSH CYTOL CELLEBRITY 1.5X140 (MISCELLANEOUS) ×3 IMPLANT
BRUSH SUPERTRAX BIOPSY (INSTRUMENTS) IMPLANT
BRUSH SUPERTRAX NDL-TIP CYTO (INSTRUMENTS) ×3 IMPLANT
CANISTER SUCTION 3000ML PPV (SUCTIONS) ×3 IMPLANT
CNTNR URN SCR LID CUP LEK RST (MISCELLANEOUS) ×3 IMPLANT
COVER BACK TABLE 60X90IN (DRAPES) ×3 IMPLANT
FILTER STRAW FLUID ASPIR (MISCELLANEOUS) IMPLANT
FORCEPS BIOP 1.5 SINGLE USE (MISCELLANEOUS) ×3 IMPLANT
FORCEPS BIOP SUPERTRX PREMAR (INSTRUMENTS) ×3 IMPLANT
GAUZE SPONGE 4X4 12PLY STRL (GAUZE/BANDAGES/DRESSINGS) ×3 IMPLANT
GLOVE BIO SURGEON STRL SZ7.5 (GLOVE) ×6 IMPLANT
GOWN STRL REUS W/ TWL LRG LVL3 (GOWN DISPOSABLE) ×6 IMPLANT
KIT CLEAN ENDO COMPLIANCE (KITS) ×3 IMPLANT
KIT LOCATABLE GUIDE (CANNULA) IMPLANT
KIT MARKER FIDUCIAL DELIVERY (KITS) IMPLANT
KIT TURNOVER KIT B (KITS) ×3 IMPLANT
MARKER SKIN DUAL TIP RULER LAB (MISCELLANEOUS) ×3 IMPLANT
NDL SUPERTRX PREMARK BIOPSY (NEEDLE) ×2 IMPLANT
NEEDLE SUPERTRX PREMARK BIOPSY (NEEDLE) ×3 IMPLANT
OIL SILICONE PENTAX (PARTS (SERVICE/REPAIRS)) ×3 IMPLANT
PAD ARMBOARD POSITIONER FOAM (MISCELLANEOUS) ×6 IMPLANT
PATCHES PATIENT (LABEL) ×9 IMPLANT
SOLN 0.9% NACL POUR BTL 1000ML (IV SOLUTION) ×3 IMPLANT
SOLN STERILE WATER BTL 1000 ML (IV SOLUTION) ×3 IMPLANT
SYR 20ML ECCENTRIC (SYRINGE) ×3 IMPLANT
SYR 20ML LL LF (SYRINGE) ×3 IMPLANT
SYR 50ML SLIP (SYRINGE) ×3 IMPLANT
TOWEL GREEN STERILE FF (TOWEL DISPOSABLE) ×3 IMPLANT
TRAP SPECIMEN MUCUS 40CC (MISCELLANEOUS) IMPLANT
TUBE CONNECTING 20X1/4 (TUBING) ×3 IMPLANT
UNDERPAD 30X36 HEAVY ABSORB (UNDERPADS AND DIAPERS) ×3 IMPLANT
VALVE BIOPSY SINGLE USE (MISCELLANEOUS) ×3 IMPLANT
VALVE SUCTION BRONCHIO DISP (MISCELLANEOUS) ×3 IMPLANT

## 2024-03-06 NOTE — Transfer of Care (Signed)
 Immediate Anesthesia Transfer of Care Note  Patient: Joanna Phelps  Procedure(s) Performed: VIDEO BRONCHOSCOPY WITH ENDOBRONCHIAL NAVIGATION  Patient Location: Endoscopy Unit  Anesthesia Type:General  Level of Consciousness: awake, alert , oriented, and patient cooperative  Airway & Oxygen Therapy: Patient Spontanous Breathing and Patient connected to face mask oxygen  Post-op Assessment: Report given to RN and Post -op Vital signs reviewed and stable  Post vital signs: Reviewed and stable  Last Vitals:  Vitals Value Taken Time  BP 130/72 03/06/24 09:53  Temp    Pulse 76 03/06/24 09:58  Resp 17 03/06/24 09:58  SpO2 100 % 03/06/24 09:58  Vitals shown include unfiled device data.  Last Pain:  Vitals:   03/06/24 0751  TempSrc:   PainSc: 4          Complications: No notable events documented.

## 2024-03-06 NOTE — Progress Notes (Signed)
 Referral to Oncology made post-procedure

## 2024-03-06 NOTE — Anesthesia Procedure Notes (Signed)
 Procedure Name: Intubation Date/Time: 03/06/2024 9:05 AM  Performed by: Cindie Donald CROME, CRNAPre-anesthesia Checklist: Patient identified, Emergency Drugs available, Suction available and Patient being monitored Patient Re-evaluated:Patient Re-evaluated prior to induction Oxygen Delivery Method: Circle System Utilized Preoxygenation: Pre-oxygenation with 100% oxygen Induction Type: IV induction Ventilation: Mask ventilation without difficulty Laryngoscope Size: Mac and 3 Grade View: Grade II Tube type: Oral Tube size: 8.5 mm Number of attempts: 1 Airway Equipment and Method: Stylet Placement Confirmation: ETT inserted through vocal cords under direct vision, positive ETCO2 and breath sounds checked- equal and bilateral Secured at: 21 cm Tube secured with: Tape Dental Injury: Teeth and Oropharynx as per pre-operative assessment

## 2024-03-06 NOTE — Op Note (Addendum)
 Video Bronchoscopy with Robotic Assisted Bronchoscopic Navigation   Date of Operation: 03/06/2024   Pre-op Diagnosis: Right lower lobe mass, left lower lobe mass, mediastinal adenopathy  Post-op Diagnosis: Same  Surgeon: Lamar Chris  Assistants: None  Anesthesia: General endotracheal anesthesia  Operation: Flexible video fiberoptic bronchoscopy with robotic assistance and biopsies.  Estimated Blood Loss: Minimal  Complications: None  Indications and History: Joanna Phelps is a 63 y.o. female with history of tobacco use, childhood asthma.  She was evaluated by chest imaging after episode hemoptysis.  There was evidence on imaging in August for 2 cavitary masses in the right and left lower lobes with associated lymphadenopathy.  Repeat CT scan of the chest in October and then PET scan confirmed these abnormalities. Recommendation made to achieve a tissue diagnosis via robotic assisted navigational bronchoscopy.  The risks, benefits, complications, treatment options and expected outcomes were discussed with the patient.  The possibilities of pneumothorax, pneumonia, reaction to medication, pulmonary aspiration, perforation of a viscus, bleeding, failure to diagnose a condition and creating a complication requiring transfusion or operation were discussed with the patient who freely signed the consent.    Description of Procedure: The patient was seen in the Preoperative Area, was examined and was deemed appropriate to proceed.  The patient was taken to [facility] Endoscopy room 3, identified as Joanna Phelps and the procedure verified as Flexible Video Fiberoptic Bronchoscopy.  A Time Out was held and the above information confirmed.   Prior to the date of the procedure a high-resolution CT scan of the chest was performed. Utilizing ION software program a virtual tracheobronchial tree was generated to allow the creation of distinct navigation pathways to the patient's parenchymal abnormalities.  After being taken to the operating room general anesthesia was initiated and the patient  was orally intubated. The video fiberoptic bronchoscope was introduced via the endotracheal tube and a general inspection was performed which showed normal right and left lung anatomy. Aspiration of the bilateral mainstems was completed to remove any remaining secretions. Robotic catheter inserted into patient's endotracheal tube.   Target #1 right lower lobe superior segment mass: The distinct navigation pathways prepared prior to this procedure were then utilized to navigate to patient's lesion identified on CT scan.  There was a distal endobronchial lesion seen with the robotic scope.  The robotic catheter was secured into place and the vision probe was withdrawn.  Lesion location was approximated using fluoroscopy.  Under fluoroscopic guidance transbronchial needle biopsies and transbronchial forceps biopsies were performed to be sent for cytology and pathology.      Target #2 left lower lobe mass: The distinct navigation pathways prepared prior to this procedure were then utilized to navigate to patient's lesion identified on CT scan.  Again a distal endobronchial lesion was noted after navigation to the target lesion.  The robotic catheter was secured into place and the vision probe was withdrawn.  Lesion location was approximated using fluoroscopy. Under fluoroscopic guidance transbronchial needle biopsies and transbronchial forceps biopsies were performed to be sent for cytology and pathology.      At the end of the procedure a general airway inspection was performed and there was no evidence of active bleeding. The bronchoscope was removed.  The patient tolerated the procedure well. There was no significant blood loss and there were no obvious complications. A post-procedural chest x-ray is pending.  Samples Target #1: 1. Transbronchial Wang needle biopsies from right lower lobe superior segment mass 2.  Transbronchial forceps biopsies  from right lower lobe superior segment mass  Samples Target #2: 1. Transbronchial Wang needle biopsies from left lower lobe mass 2. Transbronchial forceps biopsies from left lower lobe mass  Other samples: 1. FoundationOne liquid test  Plans:  The patient will be discharged from the PACU to home when recovered from anesthesia and after chest x-ray is reviewed. We will review the cytology, pathology results with the patient when they become available. Outpatient followup will be with CANDIE Lites, NP and Dr. Adrien Guan.   Lamar Chris, MD, PhD 03/06/2024, 9:50 AM [place] [phone] or if no answer before 7:00PM call [phone] For any issues after 7:00PM please call eLink [phone]

## 2024-03-06 NOTE — Interval H&P Note (Signed)
 History and Physical Interval Note:  03/06/2024 7:32 AM  Joanna Phelps  has presented today for surgery, with the diagnosis of metastatic lung cancer diagnosis.  The various methods of treatment have been discussed with the patient and family. After consideration of risks, benefits and other options for treatment, the patient has consented to  Procedures: VIDEO BRONCHOSCOPY WITH ENDOBRONCHIAL NAVIGATION (N/A) ENDOBRONCHIAL ULTRASOUND (EBUS) (Bilateral) as a surgical intervention.  The patient's history has been reviewed, patient examined. She feels better, still had a little residual PND and cough. She is completing a course of doxycycline . I believe she is stable for surgery.  I have reviewed the patient's chart and labs.  Questions were answered to the patient's satisfaction.     Lamar GORMAN Chris

## 2024-03-06 NOTE — Discharge Instructions (Signed)

## 2024-03-06 NOTE — Telephone Encounter (Signed)
 Nothing further needed

## 2024-03-06 NOTE — Anesthesia Preprocedure Evaluation (Signed)
 "                                  Anesthesia Evaluation  Patient identified by MRN, date of birth, ID band Patient awake    Reviewed: Allergy & Precautions, NPO status , Patient's Chart, lab work & pertinent test results  History of Anesthesia Complications Negative for: history of anesthetic complications  Airway Mallampati: II  TM Distance: >3 FB Neck ROM: Full    Dental  (+) Dental Advisory Given, Chipped, Missing, Poor Dentition   Pulmonary neg COPD, Recent URI , Resolved, Current SmokerPatient did not abstain from smoking.   breath sounds clear to auscultation       Cardiovascular negative cardio ROS  Rhythm:Regular     Neuro/Psych negative neurological ROS  negative psych ROS   GI/Hepatic negative GI ROS, Neg liver ROS,,,  Endo/Other  negative endocrine ROSneg diabetes    Renal/GU negative Renal ROSLab Results      Component                Value               Date                      NA                       139                 03/06/2024                K                        4.2                 03/06/2024                CO2                      25                  03/06/2024                GLUCOSE                  86                  03/06/2024                BUN                      13                  03/06/2024                CREATININE               0.67                03/06/2024                CALCIUM                   9.1                 03/06/2024  GFRNONAA                 >60                 03/06/2024                Musculoskeletal negative musculoskeletal ROS (+)    Abdominal   Peds  Hematology  (+) Blood dyscrasia, anemia Lab Results      Component                Value               Date                      WBC                      4.7                 03/06/2024                HGB                      11.7 (L)            03/06/2024                HCT                      36.4                03/06/2024                 MCV                      85.4                03/06/2024                PLT                      329                 03/06/2024              Anesthesia Other Findings   Reproductive/Obstetrics                              Anesthesia Physical Anesthesia Plan  ASA: 2  Anesthesia Plan: General   Post-op Pain Management: Minimal or no pain anticipated   Induction: Intravenous  PONV Risk Score and Plan: 2 and Propofol  infusion, Treatment may vary due to age or medical condition, Ondansetron  and Dexamethasone   Airway Management Planned: Oral ETT  Additional Equipment: None  Intra-op Plan:   Post-operative Plan: Extubation in OR  Informed Consent: I have reviewed the patients History and Physical, chart, labs and discussed the procedure including the risks, benefits and alternatives for the proposed anesthesia with the patient or authorized representative who has indicated his/her understanding and acceptance.     Dental advisory given  Plan Discussed with: CRNA  Anesthesia Plan Comments:         Anesthesia Quick Evaluation  "

## 2024-03-07 ENCOUNTER — Encounter (HOSPITAL_COMMUNITY): Payer: Self-pay | Admitting: Emergency Medicine

## 2024-03-07 LAB — CYTOLOGY - NON PAP

## 2024-03-07 NOTE — Anesthesia Postprocedure Evaluation (Signed)
"   Anesthesia Post Note  Patient: Joanna Phelps  Procedure(s) Performed: VIDEO BRONCHOSCOPY WITH ENDOBRONCHIAL NAVIGATION BRONCHOSCOPY, WITH NEEDLE ASPIRATION BIOPSY BRONCHOSCOPY, WITH BIOPSY     Patient location during evaluation: PACU Anesthesia Type: General Level of consciousness: awake and alert Pain management: pain level controlled Vital Signs Assessment: post-procedure vital signs reviewed and stable Respiratory status: spontaneous breathing, nonlabored ventilation, respiratory function stable and patient connected to nasal cannula oxygen Cardiovascular status: blood pressure returned to baseline and stable Postop Assessment: no apparent nausea or vomiting Anesthetic complications: no   No notable events documented.              Borghild Thaker      "

## 2024-03-08 ENCOUNTER — Other Ambulatory Visit: Payer: Self-pay | Admitting: Medical Oncology

## 2024-03-08 DIAGNOSIS — C3490 Malignant neoplasm of unspecified part of unspecified bronchus or lung: Secondary | ICD-10-CM

## 2024-03-13 ENCOUNTER — Inpatient Hospital Stay: Attending: Internal Medicine | Admitting: Internal Medicine

## 2024-03-13 ENCOUNTER — Inpatient Hospital Stay

## 2024-03-13 VITALS — BP 122/80 | HR 66 | Temp 97.4°F | Resp 17 | Ht 67.0 in | Wt 145.0 lb

## 2024-03-13 DIAGNOSIS — R634 Abnormal weight loss: Secondary | ICD-10-CM | POA: Insufficient documentation

## 2024-03-13 DIAGNOSIS — Z886 Allergy status to analgesic agent status: Secondary | ICD-10-CM | POA: Diagnosis not present

## 2024-03-13 DIAGNOSIS — C3431 Malignant neoplasm of lower lobe, right bronchus or lung: Secondary | ICD-10-CM | POA: Insufficient documentation

## 2024-03-13 DIAGNOSIS — F1290 Cannabis use, unspecified, uncomplicated: Secondary | ICD-10-CM | POA: Diagnosis not present

## 2024-03-13 DIAGNOSIS — C771 Secondary and unspecified malignant neoplasm of intrathoracic lymph nodes: Secondary | ICD-10-CM | POA: Diagnosis not present

## 2024-03-13 DIAGNOSIS — M549 Dorsalgia, unspecified: Secondary | ICD-10-CM | POA: Insufficient documentation

## 2024-03-13 DIAGNOSIS — C3490 Malignant neoplasm of unspecified part of unspecified bronchus or lung: Secondary | ICD-10-CM

## 2024-03-13 DIAGNOSIS — C3432 Malignant neoplasm of lower lobe, left bronchus or lung: Secondary | ICD-10-CM | POA: Diagnosis present

## 2024-03-13 DIAGNOSIS — K649 Unspecified hemorrhoids: Secondary | ICD-10-CM | POA: Insufficient documentation

## 2024-03-13 DIAGNOSIS — F17210 Nicotine dependence, cigarettes, uncomplicated: Secondary | ICD-10-CM | POA: Diagnosis not present

## 2024-03-13 DIAGNOSIS — R5383 Other fatigue: Secondary | ICD-10-CM | POA: Insufficient documentation

## 2024-03-13 LAB — CBC WITH DIFFERENTIAL (CANCER CENTER ONLY)
Abs Immature Granulocytes: 0.01 K/uL (ref 0.00–0.07)
Basophils Absolute: 0 K/uL (ref 0.0–0.1)
Basophils Relative: 1 %
Eosinophils Absolute: 0.1 K/uL (ref 0.0–0.5)
Eosinophils Relative: 1 %
HCT: 30 % — ABNORMAL LOW (ref 36.0–46.0)
Hemoglobin: 10.1 g/dL — ABNORMAL LOW (ref 12.0–15.0)
Immature Granulocytes: 0 %
Lymphocytes Relative: 28 %
Lymphs Abs: 1.9 K/uL (ref 0.7–4.0)
MCH: 27.2 pg (ref 26.0–34.0)
MCHC: 33.7 g/dL (ref 30.0–36.0)
MCV: 80.6 fL (ref 80.0–100.0)
Monocytes Absolute: 0.8 K/uL (ref 0.1–1.0)
Monocytes Relative: 12 %
Neutro Abs: 3.9 K/uL (ref 1.7–7.7)
Neutrophils Relative %: 58 %
Platelet Count: 349 K/uL (ref 150–400)
RBC: 3.72 MIL/uL — ABNORMAL LOW (ref 3.87–5.11)
RDW: 14.6 % (ref 11.5–15.5)
WBC Count: 6.7 K/uL (ref 4.0–10.5)
nRBC: 0 % (ref 0.0–0.2)

## 2024-03-13 LAB — CMP (CANCER CENTER ONLY)
ALT: 9 U/L (ref 0–44)
AST: 14 U/L — ABNORMAL LOW (ref 15–41)
Albumin: 3.4 g/dL — ABNORMAL LOW (ref 3.5–5.0)
Alkaline Phosphatase: 63 U/L (ref 38–126)
Anion gap: 8 (ref 5–15)
BUN: 14 mg/dL (ref 8–23)
CO2: 28 mmol/L (ref 22–32)
Calcium: 8.6 mg/dL — ABNORMAL LOW (ref 8.9–10.3)
Chloride: 106 mmol/L (ref 98–111)
Creatinine: 0.71 mg/dL (ref 0.44–1.00)
GFR, Estimated: >60 mL/min
Glucose, Bld: 100 mg/dL — ABNORMAL HIGH (ref 70–99)
Potassium: 4 mmol/L (ref 3.5–5.1)
Sodium: 141 mmol/L (ref 135–145)
Total Bilirubin: 0.2 mg/dL (ref 0.0–1.2)
Total Protein: 6.9 g/dL (ref 6.5–8.1)

## 2024-03-13 MED ORDER — PROCHLORPERAZINE MALEATE 10 MG PO TABS: 10.0000 mg | ORAL_TABLET | Freq: Four times a day (QID) | ORAL | 1 refills | Status: AC | PRN

## 2024-03-13 MED ORDER — LIDOCAINE-PRILOCAINE 2.5-2.5 % EX CREA: TOPICAL_CREAM | CUTANEOUS | 3 refills | Status: AC

## 2024-03-13 MED ORDER — ONDANSETRON HCL 8 MG PO TABS: 8.0000 mg | ORAL_TABLET | Freq: Three times a day (TID) | ORAL | 1 refills | Status: AC | PRN

## 2024-03-13 NOTE — Progress Notes (Signed)
 "   [facility] Telephone:([phone]) [phone]   Fax:([phone]) [phone]  CONSULT NOTE  REFERRING PHYSICIAN: Dr. Lamar Chris  REASON FOR CONSULTATION:  63 years old female recently diagnosed with lung cancer  HPI Joanna Phelps is a 63 y.o. female came to the clinic today for initial evaluation of lung cancer accompanied by her sister.   HPI  Discussed the use of AI scribe software for clinical note transcription with the patient, who gave verbal consent to proceed.  History of Present Illness Joanna Phelps is a 63 year old woman with newly diagnosed stage IV squamous cell carcinoma of the lung who presents for initial oncology consultation to discuss diagnosis, staging, and initiation of systemic therapy.  She developed cough with episodes of hemoptysis, malaise, chest pain, dyspnea, and weakness in early October 2025. She was diagnosed with pneumonia at that time. Imaging on December 16, 2023, revealed bilateral lower lobe cavitary pulmonary masses (left 4.2 x 4.5 cm, right 4.1 x 3.3 cm) and right hilar lymphadenopathy (2.3 x 2.2 cm). Repeat imaging on December 30, 2023, showed persistent findings.  A PET scan on February 21, 2024 showed the bilateral hypermetabolic thick-walled cavitary lung masses suspicious for primary bronchogenic malignancy.  The right measured 4.2 x 3.8 cm in the lower lobe with SUV max of 19.1 and the left lower lobe measured 4.6 cm with SUV max of 26.  There was also hypermetabolic nodal disease involving the right infrahilar with additional subcarinal nodal focus most compatible with nodal metastasis.  Bronchoscopy with biopsy on March 06, 2024, confirmed squamous cell carcinoma in both lungs. She currently experiences daily fatigue and has had unintentional weight loss of approximately 45 pounds since March 2025, which she partially attributes to increased physical activity related to caregiving and work. She denies current chest pain, dyspnea, cough, or  hemoptysis. She denies headaches, visual changes, or lower extremity edema. She reports intermittent back pain. Pulmonary function testing was normal.  She is not taking antihypertensive or lipid-lowering medications. She takes medication for a prior genital disease and has hemorrhoids. She is allergic to ibuprofen, which causes urticaria. Family history significant for mother with diabetes mellitus and hypertension.  She had several sister with diabetes and hypertension. The patient is single and has no children.  She works in a store.  She has a history of smoking for around 43 years and still smoking few cigarettes every day.  She also has a history of alcohol abuse but she does not drink regularly these days.  She has a history of smoking marijuana.    Past Medical History:  Diagnosis Date   Chlamydia    Hemorrhoids       Past Surgical History:  Procedure Laterality Date   Boil Lanced     BREAST BIOPSY Right 08/05/2023   MM RT BREAST BX W LOC DEV 1ST LESION IMAGE BX SPEC STEREO GUIDE 08/05/2023 [facility]   BREAST BIOPSY Right 08/05/2023   MM RT BREAST BX W LOC DEV EA AD LESION IMG BX SPEC STEREO GUIDE 08/05/2023 [facility]   BRONCHIAL BIOPSY  03/06/2024   Procedure: BRONCHOSCOPY, WITH BIOPSY;  Surgeon: Chris Lamar RAMAN, MD;  Location: [facility];  Service: Pulmonary;;   BRONCHIAL NEEDLE ASPIRATION BIOPSY  03/06/2024   Procedure: BRONCHOSCOPY, WITH NEEDLE ASPIRATION BIOPSY;  Surgeon: Chris Lamar RAMAN, MD;  Location: [facility];  Service: Pulmonary;;   VIDEO BRONCHOSCOPY WITH ENDOBRONCHIAL NAVIGATION N/A 03/06/2024   Procedure: VIDEO BRONCHOSCOPY WITH ENDOBRONCHIAL NAVIGATION;  Surgeon: Chris Lamar  S, MD;  Location: [facility];  Service: Pulmonary;  Laterality: N/A;    Family History  Problem Relation Age of Onset   Diabetes Mother    Hypertension Mother    Hypertension Sister    Diabetes Sister    Hypertension Sister    Hypertension Sister    Hypertension  Sister    Hypertension Sister     Social History Social History[1]  Allergies[2]  Current Outpatient Medications  Medication Sig Dispense Refill   amLODipine  (NORVASC ) 10 MG tablet Take 1 tablet (10 mg total) by mouth daily. (Patient not taking: Reported on 02/15/2024) 90 tablet 3   atorvastatin  (LIPITOR) 20 MG tablet Take 1 tablet (20 mg total) by mouth daily. (Patient not taking: Reported on 02/15/2024) 90 tablet 3   baclofen  (LIORESAL ) 10 MG tablet Take 0.5-1 tablets (5-10 mg total) by mouth at bedtime as needed for muscle spasms. (Patient not taking: Reported on 02/15/2024) 30 each 3   buPROPion  (WELLBUTRIN  SR) 150 MG 12 hr tablet Take 1 tablet (150 mg total) by mouth 2 (two) times daily. (Patient not taking: Reported on 02/15/2024) 180 tablet 1   diphenhydramine-acetaminophen  (TYLENOL  PM) 25-500 MG TABS tablet Take 1 tablet by mouth at bedtime as needed (sleep). (Patient not taking: Reported on 02/15/2024)     doxycycline  (VIBRA -TABS) 100 MG tablet Take 1 tablet (100 mg total) by mouth 2 (two) times daily. 10 tablet 0   hydrochlorothiazide  (HYDRODIURIL ) 25 MG tablet Take 1 tablet (25 mg total) by mouth daily. (Patient not taking: Reported on 02/15/2024) 90 tablet 1   loperamide  (IMODIUM ) 2 MG capsule Take 1 capsule (2 mg total) by mouth 4 (four) times daily as needed for diarrhea or loose stools. (Patient not taking: Reported on 02/15/2024) 12 capsule 0   ondansetron  (ZOFRAN -ODT) 4 MG disintegrating tablet Take 1 tablet (4 mg total) by mouth every 8 (eight) hours as needed. (Patient not taking: Reported on 02/15/2024) 20 tablet 0   traZODone (DESYREL) 100 MG tablet Take 100 mg by mouth at bedtime.     VENTOLIN HFA 108 (90 Base) MCG/ACT inhaler Inhale 2 puffs into the lungs every 4 (four) hours as needed.     No current facility-administered medications for this visit.    Review of Systems  Constitutional: positive for anorexia, fatigue, and weight loss Eyes: negative Ears, nose, mouth,  throat, and face: negative Respiratory: negative Cardiovascular: negative Gastrointestinal: negative Genitourinary:negative Integument/breast: negative Hematologic/lymphatic: negative Musculoskeletal:negative Neurological: negative Behavioral/Psych: negative Endocrine: negative Allergic/Immunologic: negative  Physical Exam  MJO:jozmu, healthy, no distress, well nourished, well developed, and anxious SKIN: skin color, texture, turgor are normal, no rashes or significant lesions HEAD: Normocephalic, No masses, lesions, tenderness or abnormalities EYES: normal, PERRLA, Conjunctiva are pink and non-injected EARS: External ears normal, Canals clear OROPHARYNX:no exudate, no erythema, and lips, buccal mucosa, and tongue normal  NECK: supple, no adenopathy, no JVD LYMPH:  no palpable lymphadenopathy, no hepatosplenomegaly BREAST:not examined LUNGS: clear to auscultation , and palpation HEART: regular rate & rhythm, no murmurs, and no gallops ABDOMEN:abdomen soft, non-tender, normal bowel sounds, and no masses or organomegaly BACK: Back symmetric, no curvature., No CVA tenderness EXTREMITIES:no joint deformities, effusion, or inflammation, no edema  NEURO: alert & oriented x 3 with fluent speech, no focal motor/sensory deficits  PERFORMANCE STATUS: ECOG 1  LABORATORY DATA: Lab Results  Component Value Date   WBC 6.7 03/13/2024   HGB 10.1 (L) 03/13/2024   HCT 30.0 (L) 03/13/2024   MCV 80.6 03/13/2024   PLT 349  03/13/2024      Chemistry      Component Value Date/Time   NA 141 03/13/2024 1104   NA 141 12/21/2019 1147   K 4.0 03/13/2024 1104   CL 106 03/13/2024 1104   CO2 28 03/13/2024 1104   BUN 14 03/13/2024 1104   BUN 10 12/21/2019 1147   CREATININE 0.71 03/13/2024 1104      Component Value Date/Time   CALCIUM  8.6 (L) 03/13/2024 1104   ALKPHOS 63 03/13/2024 1104   AST 14 (L) 03/13/2024 1104   ALT 9 03/13/2024 1104   BILITOT 0.2 03/13/2024 1104        RADIOGRAPHIC STUDIES: DG Chest Port 1 View Result Date: 03/06/2024 CLINICAL DATA:  Status post bronchoscopy with biopsy. EXAM: PORTABLE CHEST 1 VIEW COMPARISON:  10/16/2023. FINDINGS: The heart size and mediastinal contours are stable. Masslike opacities are noted in the perihilar region on bilaterally with evidence of cavitation on the right. The left mass is partially obscured due to increased consolidation. No effusion or pneumothorax is seen. No acute osseous abnormality. IMPRESSION: 1. New consolidation in the mid to lower left lung, which may represent parenchymal hemorrhage given recent biopsy. No pneumothorax is seen. 2. Perihilar masses Electronically Signed   By: Leita Birmingham M.D.   On: 03/06/2024 11:05   DG C-Arm 1-60 Min-No Report Result Date: 03/06/2024 Fluoroscopy was utilized by the requesting physician.  No radiographic interpretation.   NM PET Image Initial (PI) Skull Base To Thigh (F-18 FDG) Result Date: 02/24/2024 EXAM: PET AND CT SKULL BASE TO MID THIGH 02/21/2024 02:01:11 PM TECHNIQUE: RADIOPHARMACEUTICAL: 7.07 mCi F-18 FDG Uptake time 60 minutes. Glucose level 90 mg/dl. PET imaging was acquired from the base of the skull to the mid thighs. Non-contrast enhanced computed tomography was obtained for attenuation correction and anatomic localization. COMPARISON: CT 12/30/2023. CLINICAL HISTORY: Lung nodule, > 8mm; lung mass bilateral with lymphoadenopathy. FINDINGS: HEAD AND NECK: No metabolically active cervical lymphadenopathy. CHEST: Cavitary mass within the superior aspect of the right lower lobe measures 4.2 x 3.8 cm with intense metabolic activity (SUV max equal to 19.1). Hypermetabolic right infrahilar lymph node measuring 2.2 cm and SUV max equal to 28. Hypermetabolic left lower lobe cavitary mass measures 4.6 cm with SUV max equals 26. Metabolic focus at the subcarinal lateral stations concerning for small lymph node metastases. Findings are most concerning for  synchronous bronchogenic carcinoma with right hilar nodal atelectasis. ABDOMEN AND PELVIS: No metabolically active lymphadenopathy. Physiologic activity within the gastrointestinal and genitourinary systems. BONES AND SOFT TISSUE: No metabolically active aggressive osseous lesion. IMPRESSION: 1. Bilateral hypermetabolic thick-walled cavitary lung masses suspicious for primary bronchogenic malignancies (right lower lobe 4.2 x 3.8 cm, SUV max 19.1; left lower lobe 4.6 cm, SUV max 26). 2. Hypermetabolic nodal disease involving the right infrahilar node (2.2 cm, SUV max 28) with additional subcarinal nodal focus most compatible with nodal metastases. Electronically signed by: Norleen Boxer MD 02/24/2024 04:36 PM EST RP Workstation: [id number]    ASSESSMENT: This is a very pleasant 63 years old African-American female with stage IV (T2b, N2, M1 A) non-small cell lung cancer, squamous cell carcinoma presented with large cavitary bilateral masses located in the left lower lobe as well as the right lower lobe with right infrahilar and subcarinal lymphadenopathy diagnosed in December 2025. Molecular studies and PD-L1 expression are still pending.  PLAN: I had a lengthy discussion with the patient and her sister today about her current disease stage, prognosis and treatment options.  I personally and  independently reviewed the imaging studies and the pathology report and discussed the result with the patient and her sister. The patient understands that she has incurable condition and all the treatment will be of palliative nature with the goal of prolongation of her life and palliation of her symptoms. She was given the option of palliative care and hospice referral versus consideration of palliative systemic chemoimmunotherapy with carboplatin for AUC of 5, paclitaxel 175 MGs/M2 and Libtayo (Cempilimab) 350 mg IV every 3 weeks for 4 cycles followed by maintenance Libtayo (Cempilimab) if there is no evidence for  disease progression after cycle #4.  She is interested in treatment Assessment and Plan Assessment & Plan Stage IV squamous cell carcinoma of the lung with lymph node involvement She has newly diagnosed, advanced, metastatic squamous cell carcinoma of the lung with bilateral lower lobe cavitary masses and right hilar and subcarinal lymph node involvement. The disease is not amenable to surgical resection or radiation. She is symptomatic with fatigue and significant weight loss, but currently denies hemoptysis, chest pain, or dyspnea. No evidence of brain or bone metastasis at present; further staging is pending. The treatment goal is palliation, aiming to prolong survival and improve quality of life, as cure is not possible. Without systemic therapy, median survival is approximately six months; with combination chemotherapy and immunotherapy, median survival is two years or longer. Chemotherapy risks include fatigue, alopecia, nausea, vomiting, cytopenias, and peripheral neuropathy. Growth factor support may cause flu-like symptoms and fatigue. Immune checkpoint inhibitor therapy may cause immune-related adverse events but is generally better tolerated than chemotherapy. Anticipated outcome is disease control, symptom improvement, and prolonged survival. - Ordered MRI brain to evaluate for intracranial metastasis prior to systemic therapy. - Ordered port placement for chemotherapy administration and topical anesthetic cream for port site care. - Initiate combination chemotherapy with carboplatin and paclitaxel every three weeks for four cycles. - Initiate immune checkpoint inhibitor therapy with cemiplimab (Libtayo) every three weeks, concurrent with chemotherapy, then as maintenance monotherapy after four cycles. - Administer growth factor injection two days after each chemotherapy cycle during the initial four cycles. - Ordered antiemetic medications for chemotherapy-induced nausea and provided  prescriptions for home use. - Scheduled chemotherapy education class with oncology nursing staff. - Referred to social work for disability application and supportive care resources. - Coordinated treatment schedule to accommodate her preferred days (Monday, Tuesday, or Thursday).  Nicotine dependence She has ongoing nicotine dependence with a greater than 40-year history of tobacco use and continues to smoke, though she has reduced daily cigarette consumption. Continued tobacco use may negatively impact cancer outcomes and overall health. - Encouraged smoking cessation and reinforced the importance of quitting to improve cancer outcomes and overall health.     The patient voices understanding of current disease status and treatment options and is in agreement with the current care plan.  All questions were answered. The patient knows to call the clinic with any problems, questions or concerns. We can certainly see the patient much sooner if necessary.  Thank you so much for allowing me to participate in the care of Joanna Phelps. I will continue to follow up the patient with you and assist in her care.  The total time spent in the appointment was 90 minutes including review of chart and various tests results, discussions about plan of care and coordination of care plan .   Disclaimer: This note was dictated with voice recognition software. Similar sounding words can inadvertently be transcribed and may not be  corrected upon review.   Joanna Phelps March 13, 2024, 12:01 PM        [1]  Social History Tobacco Use   Smoking status: Every Day    Current packs/day: 0.25    Types: Cigarettes   Smokeless tobacco: Never   Tobacco comments:    6 cig per day 11/25    Patients stated she smokes 3 cigarettes a day 02/15/2024 (as)  Vaping Use   Vaping status: Never Used  Substance Use Topics   Alcohol use: No   Drug use: Yes    Types: Marijuana    Comment: every day, last  smoked 12/5  [2]  Allergies Allergen Reactions   Ibuprofen Itching, Rash and Hives   "

## 2024-03-13 NOTE — Progress Notes (Signed)
 START ON PATHWAY REGIMEN - Non-Small Cell Lung     A cycle is every 21 days:     Pembrolizumab      Paclitaxel      Carboplatin   **Always confirm dose/schedule in your pharmacy ordering system**  Patient Characteristics: Stage IV Metastatic, Squamous, Molecular Analysis Not Elected, PS = 0, 1, Initial Chemotherapy/Immunotherapy, Immunotherapy Candidate, PD-L1 Expression Positive 1-49% (TPS) / Negative / Not Tested / Awaiting Test Results Therapeutic Status: Stage IV Metastatic Histology: Squamous Cell Chemotherapy/Immunotherapy Line of Therapy: Initial Chemotherapy/Immunotherapy ECOG Performance Status: 1 Immunotherapy Candidate Status: Candidate for Immunotherapy PD-L1 Expression Status: Awaiting Test Results Intent of Therapy: Non-Curative / Palliative Intent, Discussed with Patient

## 2024-03-14 ENCOUNTER — Inpatient Hospital Stay: Admitting: Licensed Clinical Social Worker

## 2024-03-14 ENCOUNTER — Other Ambulatory Visit: Payer: Self-pay

## 2024-03-14 DIAGNOSIS — C3431 Malignant neoplasm of lower lobe, right bronchus or lung: Secondary | ICD-10-CM

## 2024-03-14 NOTE — Progress Notes (Signed)
 [facility] Clinical Social Work  Initial Assessment   Joanna Phelps is a 63 y.o. year old female contacted by phone. Clinical Social Work was referred by medical provider for assessment of psychosocial needs.   SDOH (Social Determinants of Health) assessments performed: Yes   SDOH Screenings   Food Insecurity: No Food Insecurity (03/13/2024)  Housing: Low Risk (03/13/2024)  Transportation Needs: No Transportation Needs (03/13/2024)  Utilities: Not At Risk (03/13/2024)  Depression (PHQ2-9): High Risk (03/13/2024)  Social Connections: Unknown (07/25/2021)   Received from [facility]  Tobacco Use: High Risk (03/06/2024)    PHQ 2/9:    03/13/2024   11:54 AM 12/09/2020    2:27 PM 05/27/2020    9:27 AM  Depression screen PHQ 2/9  Decreased Interest 2 2 1   Down, Depressed, Hopeless 2 2 2   PHQ - 2 Score 4 4 3   Altered sleeping 2 2 3   Tired, decreased energy 2 2 3   Change in appetite 0 1 1  Feeling bad or failure about yourself  0 1 1  Trouble concentrating 3 3 2   Moving slowly or fidgety/restless 0 1 1  Suicidal thoughts 0 0 0  PHQ-9 Score 11 14  14    Difficult doing work/chores   Somewhat difficult     Data saved with a previous flowsheet row definition     Distress Screen completed: No     No data to display            Family/Social Information:  Housing Arrangement: patient lives with her boyfriend.  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  Pt states she has some assistance from her boyfriend's sister and he also has CAP services in home.  Family members/support persons in your life? Per pt she has 8 siblings who are able to offer some support as needed.   Transportation concerns: no  Employment: Working part time at [employer].  Income source: Employment Financial concerns: Yes, current concerns Type of concern: Utilities and Rent/ mortgage Food access concerns: no Religious or spiritual  practice: Not known Advanced directives: No Services Currently in place:  none  Coping/ Adjustment to diagnosis: Patient understands treatment plan and what happens next? yes Concerns about diagnosis and/or treatment: How I will care for other members of my family, Losing my job and/or losing income, Overwhelmed by information, How will I care for myself, and Quality of life Patient reported stressors: Finances, Partner, Depression, Anxiety/ nervousness, and Adjusting to my illness Hopes and/or priorities: pt's priority is to start treatment w/ the hope of positive results. Patient enjoys time with family/ friends Current coping skills/ strengths: Motivation for treatment/growth , Physical Health , and Supportive family/friends     SUMMARY: Current SDOH Barriers:  Financial constraints related to limited income  Clinical Social Work Clinical Goal(s):  Explore community resource options for unmet needs related to:  Financial Strain   Interventions: Discussed common feeling and emotions when being diagnosed with cancer, and the importance of support during treatment Informed patient of the support team roles and support services at [facility] Provided CSW contact information and encouraged patient to call with any questions or concerns Provided patient with information about the [employer].  Pt does receive SNAP benefits, meeting presumptive eligibility requirement.  Pt to meet w/ CSW on 1/5 to sign the [facility] referral to apply for SSDI.  Pt aware of lung cancer support group as well as individual counseling available.  Follow Up Plan: CSW will see patient on 1/5 Patient verbalizes understanding of plan: Yes    Joanna JONELLE Manna, LCSW Clinical Social Worker [facility]  Patient is participating in a Managed Medicaid Plan:  Yes

## 2024-03-17 ENCOUNTER — Telehealth: Payer: Self-pay

## 2024-03-17 ENCOUNTER — Telehealth (HOSPITAL_COMMUNITY): Payer: Self-pay

## 2024-03-17 NOTE — Telephone Encounter (Signed)
"  See telephone note  "

## 2024-03-17 NOTE — Telephone Encounter (Signed)
LMOM for pt to call back to get scheduled

## 2024-03-20 ENCOUNTER — Inpatient Hospital Stay: Admission: RE | Admit: 2024-03-20 | Source: Ambulatory Visit

## 2024-03-20 ENCOUNTER — Inpatient Hospital Stay: Attending: Internal Medicine

## 2024-03-20 ENCOUNTER — Ambulatory Visit (HOSPITAL_COMMUNITY): Admission: RE | Admit: 2024-03-20 | Source: Ambulatory Visit

## 2024-03-20 ENCOUNTER — Inpatient Hospital Stay: Admitting: Licensed Clinical Social Worker

## 2024-03-20 DIAGNOSIS — R634 Abnormal weight loss: Secondary | ICD-10-CM | POA: Insufficient documentation

## 2024-03-20 DIAGNOSIS — C3431 Malignant neoplasm of lower lobe, right bronchus or lung: Secondary | ICD-10-CM | POA: Insufficient documentation

## 2024-03-20 DIAGNOSIS — D649 Anemia, unspecified: Secondary | ICD-10-CM | POA: Insufficient documentation

## 2024-03-20 DIAGNOSIS — C3432 Malignant neoplasm of lower lobe, left bronchus or lung: Secondary | ICD-10-CM | POA: Insufficient documentation

## 2024-03-21 ENCOUNTER — Ambulatory Visit: Admitting: Acute Care

## 2024-03-21 ENCOUNTER — Encounter: Payer: Self-pay | Admitting: Internal Medicine

## 2024-03-21 ENCOUNTER — Ambulatory Visit (HOSPITAL_COMMUNITY)
Admission: RE | Admit: 2024-03-21 | Discharge: 2024-03-21 | Disposition: A | Discharge status: Home / Self Care | Source: Ambulatory Visit | Attending: Internal Medicine | Admitting: Internal Medicine

## 2024-03-21 DIAGNOSIS — C3490 Malignant neoplasm of unspecified part of unspecified bronchus or lung: Secondary | ICD-10-CM | POA: Diagnosis present

## 2024-03-21 MED ORDER — GADOBUTROL 1 MMOL/ML IV SOLN
6.5000 mL | Freq: Once | INTRAVENOUS | Status: AC | PRN
  Administered 2024-03-21: 6.5 mL via INTRAVENOUS

## 2024-03-21 NOTE — Progress Notes (Signed)
 [facility] CSW Progress Note  Visual Merchandiser met with patient to follow-up on disability concerns.    Interventions: CSW completed and submitted a [facility] referral on behalf of pt to start the application for SSDI.  Pt will apply for the [employer] once treatment starts.  CSW also went over Advanced Directives w/ pt which pt will complete and have notarized once she has spoken with family.        Follow Up Plan:  Patient will contact CSW with any support or resource needs    Devere JONELLE Manna, LCSW Clinical Social Worker [facility]    Patient is participating in a Managed Medicaid Plan:  Yes

## 2024-03-22 ENCOUNTER — Telehealth: Payer: Self-pay | Admitting: Medical Oncology

## 2024-03-22 NOTE — Progress Notes (Signed)
 Pharmacist Chemotherapy Monitoring - Initial Assessment    Anticipated start date: 03/24/2024   The following has been reviewed per standard work regarding the patient's treatment regimen: The patient's diagnosis, treatment plan and drug doses, and organ/hematologic function Lab orders and baseline tests specific to treatment regimen  The treatment plan start date, drug sequencing, and pre-medications Prior authorization status  Patient's documented medication list, including drug-drug interaction screen and prescriptions for anti-emetics and supportive care specific to the treatment regimen The drug concentrations, fluid compatibility, administration routes, and timing of the medications to be used The patient's access for treatment and lifetime cumulative dose history, if applicable  The patient's medication allergies and previous infusion related reactions, if applicable   Changes made to treatment plan:  treatment plan date  Follow up needed:  N/A   Bridgett Leotis Helling, RPH, BCPS, BCOP 03/22/2024   9:08 AM

## 2024-03-22 NOTE — Telephone Encounter (Signed)
 Chemo scheduled for Friday  Patient reports dental/gum issues related to long-term teeth grinding. She states she has several teeth embedded in the gum that are not visible and reports two chipped anterior teeth.   Patient called today to inquire whether she should proceed with scheduled chemotherapy on Friday due to the gum issue. She denies dental pain and denies gum bleeding, except with vigorous brushing. No other oral complaints reported at this time.

## 2024-03-23 ENCOUNTER — Telehealth: Payer: Self-pay | Admitting: Medical Oncology

## 2024-03-23 MED FILL — Fosaprepitant Dimeglumine For IV Infusion 150 MG (Base Eq): INTRAVENOUS | Qty: 5 | Status: AC

## 2024-03-23 NOTE — Telephone Encounter (Signed)
 I told pt that Dr. Sherrod is delaying her treatment to next week because of her dental issues. She will contact a dentist for appt.and call us  back w the appt.

## 2024-03-24 ENCOUNTER — Encounter: Payer: Self-pay | Admitting: Internal Medicine

## 2024-03-24 ENCOUNTER — Inpatient Hospital Stay

## 2024-03-24 ENCOUNTER — Telehealth: Payer: Self-pay

## 2024-03-24 ENCOUNTER — Inpatient Hospital Stay: Admitting: Nutrition

## 2024-03-24 NOTE — Telephone Encounter (Signed)
 Pt called to inquire about appts scheduled at [facility] next week.  Per Cassie, appts will need to be rescheduled after pt has dental appt.  Pt verbalized understanding and will call ASAP she gets that appt.

## 2024-03-24 NOTE — Progress Notes (Signed)
 Nutrition appointment cancelled because chemotherapy appointment cancelled today. Patient rescheduled for Jan 29.

## 2024-03-26 NOTE — H&P (Incomplete)
 "    Chief Complaint: Patient was seen in consultation today for Port-A-Cath placement and assistance with management of chemotherapy administration for right lower lobe lung cancer.  Referring Provider(s): Dr. Gatha Kluver, MD   Supervising Physician: Jenna Hacker  Patient Status: [facility] - Out-pt  Patient is Full Code  History of Present Illness: [facility] is a 64 y.o. female  with PMHx notable for right lower lobe cancer.  Per Dr. Jeannett progress note dated 12/29: She developed cough with episodes of hemoptysis, malaise, chest pain, dyspnea, and weakness in early October 2025. She was diagnosed with pneumonia at that time. Imaging on December 16, 2023, revealed bilateral lower lobe cavitary pulmonary masses (left 4.2 x 4.5 cm, right 4.1 x 3.3 cm) and right hilar lymphadenopathy (2.3 x 2.2 cm). Repeat imaging on December 30, 2023, showed persistent findings.  A PET scan on February 21, 2024 showed the bilateral hypermetabolic thick-walled cavitary lung masses suspicious for primary bronchogenic malignancy.  The right measured 4.2 x 3.8 cm in the lower lobe with SUV max of 19.1 and the left lower lobe measured 4.6 cm with SUV max of 26.  There was also hypermetabolic nodal disease involving the right infrahilar with additional subcarinal nodal focus most compatible with nodal metastasis.  Bronchoscopy with biopsy on March 06, 2024, confirmed squamous cell carcinoma in both lungs. She currently experiences daily fatigue and has had unintentional weight loss of approximately 45 pounds since March 2025, which she partially attributes to increased physical activity related to caregiving and work.  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 Without systemic therapy, median survival is approximately six months; with combination  chemotherapy and immunotherapy, median survival is two years or longer. [...] - Ordered MRI brain to evaluate for intracranial metastasis prior to systemic therapy. - Ordered port placement for chemotherapy administration and topical anesthetic cream for port site care. - Initiate combination chemotherapy with carboplatin and paclitaxel every three weeks for four cycles.   Interventional Radiology was requested for Port-A-Cath placement. Patient is scheduled for same in IR today.   ***Patient is alert and laying in bed, calm.  Patient is currently without any significant complaints.  Patient denies any fevers, headache, chest pain, SOB, cough, abdominal pain, nausea, vomiting or bleeding.     Past Medical History:  Diagnosis Date   Chlamydia    Hemorrhoids     Past Surgical History:  Procedure Laterality Date   Boil Lanced     BREAST BIOPSY Right 08/05/2023   MM RT BREAST BX W LOC DEV 1ST LESION IMAGE BX SPEC STEREO GUIDE 08/05/2023 [facility]   BREAST BIOPSY Right 08/05/2023   MM RT BREAST BX W LOC DEV EA AD LESION IMG BX SPEC STEREO GUIDE 08/05/2023 [facility]   BRONCHIAL BIOPSY  03/06/2024   Procedure: BRONCHOSCOPY, WITH BIOPSY;  Surgeon: Shelah Lamar RAMAN, MD;  Location: [facility];  Service: Pulmonary;;   BRONCHIAL NEEDLE ASPIRATION BIOPSY  03/06/2024   Procedure: BRONCHOSCOPY, WITH NEEDLE ASPIRATION BIOPSY;  Surgeon: Shelah Lamar RAMAN, MD;  Location: [facility];  Service: Pulmonary;;   VIDEO BRONCHOSCOPY WITH ENDOBRONCHIAL NAVIGATION N/A 03/06/2024   Procedure: VIDEO BRONCHOSCOPY WITH ENDOBRONCHIAL NAVIGATION;  Surgeon: Shelah Lamar RAMAN, MD;  Location: [facility];  Service: Pulmonary;  Laterality: N/A;    Allergies: Ibuprofen  Medications: Prior to Admission medications  Medication Sig  Start Date End Date Taking? Authorizing Provider  amLODipine  (NORVASC ) 10 MG tablet Take 1 tablet (10 mg total) by mouth daily. Patient not taking: Reported on 02/15/2024  05/27/20   Celestia Rosaline SQUIBB, NP  atorvastatin  (LIPITOR) 20 MG tablet Take 1 tablet (20 mg total) by mouth daily. Patient not taking: Reported on 02/15/2024 05/27/20   Celestia Rosaline SQUIBB, NP  baclofen  (LIORESAL ) 10 MG tablet Take 0.5-1 tablets (5-10 mg total) by mouth at bedtime as needed for muscle spasms. Patient not taking: Reported on 02/15/2024 01/08/20   Hilts, Ozell, MD  buPROPion  (WELLBUTRIN  SR) 150 MG 12 hr tablet Take 1 tablet (150 mg total) by mouth 2 (two) times daily. Patient not taking: Reported on 02/15/2024 05/27/20   Celestia Rosaline SQUIBB, NP  diphenhydramine-acetaminophen  (TYLENOL  PM) 25-500 MG TABS tablet Take 1 tablet by mouth at bedtime as needed (sleep). Patient not taking: Reported on 02/15/2024    [provider]  doxycycline  (VIBRA -TABS) 100 MG tablet Take 1 tablet (100 mg total) by mouth 2 (two) times daily. 03/03/24   Geronimo Amel, MD  hydrochlorothiazide  (HYDRODIURIL ) 25 MG tablet Take 1 tablet (25 mg total) by mouth daily. Patient not taking: Reported on 02/15/2024 05/27/20   Celestia Rosaline SQUIBB, NP  lidocaine -prilocaine  (EMLA ) cream Apply to affected area once 03/13/24   Sherrod Sherrod, MD  loperamide  (IMODIUM ) 2 MG capsule Take 1 capsule (2 mg total) by mouth 4 (four) times daily as needed for diarrhea or loose stools. Patient not taking: Reported on 02/15/2024 07/17/23   Henderly, Britni A, PA-C  ondansetron  (ZOFRAN ) 8 MG tablet Take 1 tablet (8 mg total) by mouth every 8 (eight) hours as needed for nausea or vomiting. Start on the third day after carboplatin. 03/13/24   Sherrod Sherrod, MD  ondansetron  (ZOFRAN -ODT) 4 MG disintegrating tablet Take 1 tablet (4 mg total) by mouth every 8 (eight) hours as needed. Patient not taking: Reported on 02/15/2024 07/17/23   Henderly, Britni A, PA-C  prochlorperazine  (COMPAZINE ) 10 MG tablet Take 1 tablet (10 mg total) by mouth every 6 (six) hours as needed for nausea or vomiting. 03/13/24   Sherrod Sherrod, MD  traZODone  (DESYREL) 100 MG tablet Take 100 mg by mouth at bedtime. 01/27/24   [provider]  VENTOLIN HFA 108 (90 Base) MCG/ACT inhaler Inhale 2 puffs into the lungs every 4 (four) hours as needed. 08/24/23   [provider]     Family History  Problem Relation Age of Onset   Diabetes Mother    Hypertension Mother    Hypertension Sister    Diabetes Sister    Hypertension Sister    Hypertension Sister    Hypertension Sister    Hypertension Sister     Social History   Socioeconomic History   Marital status: Single    Spouse name: Not on file   Number of children: 0   Years of education: Not on file   Highest education level: 11th grade  Occupational History   Not on file  Tobacco Use   Smoking status: Every Day    Current packs/day: 0.25    Types: Cigarettes   Smokeless tobacco: Never   Tobacco comments:    6 cig per day 11/25    Patients stated she smokes 3 cigarettes a day 02/15/2024 (as)  Vaping Use   Vaping status: Never Used  Substance and Sexual Activity   Alcohol use: No   Drug use: Yes    Types: Marijuana    Comment: every  day, last smoked 12/5   Sexual activity: Yes    Birth control/protection: None  Other Topics Concern   Not on file  Social History Narrative   Not on file   Social Drivers of Health   Tobacco Use: High Risk (03/06/2024)   Patient History    Smoking Tobacco Use: Every Day    Smokeless Tobacco Use: Never    Passive Exposure: Not on file  Financial Resource Strain: Not on file  Food Insecurity: No Food Insecurity (03/13/2024)   Epic    Worried About Programme Researcher, Broadcasting/film/video in the Last Year: Never true    Ran Out of Food in the Last Year: Never true  Transportation Needs: No Transportation Needs (03/13/2024)   Epic    Lack of Transportation (Medical): No    Lack of Transportation (Non-Medical): No  Physical Activity: Not on file  Stress: Not on file  Social Connections: Unknown (07/25/2021)   Received from [facility]    Social Network    Social Network: Not on file  Depression (PHQ2-9): High Risk (03/13/2024)   Depression (PHQ2-9)    PHQ-2 Score: 11  Alcohol Screen: Not on file  Housing: Low Risk (03/13/2024)   Epic    Unable to Pay for Housing in the Last Year: No    Number of Times Moved in the Last Year: 0    Homeless in the Last Year: No  Utilities: Not At Risk (03/13/2024)   Epic    Threatened with loss of utilities: No  Health Literacy: Not on file     Review of Systems: A 12 point ROS discussed and pertinent positives are indicated in the HPI above.  All other systems are negative.  Vital Signs: LMP 10/18/2019   Advance Care Plan: The advanced care place/surrogate decision maker was discussed at the time of visit and the patient did not wish to discuss or was not able to name a surrogate decision maker or provide an advance care plan.  Physical Exam  Imaging: DG Chest Port 1 View Result Date: 03/06/2024 CLINICAL DATA:  Status post bronchoscopy with biopsy. EXAM: PORTABLE CHEST 1 VIEW COMPARISON:  10/16/2023. FINDINGS: The heart size and mediastinal contours are stable. Masslike opacities are noted in the perihilar region on bilaterally with evidence of cavitation on the right. The left mass is partially obscured due to increased consolidation. No effusion or pneumothorax is seen. No acute osseous abnormality. IMPRESSION: 1. New consolidation in the mid to lower left lung, which may represent parenchymal hemorrhage given recent biopsy. No pneumothorax is seen. 2. Perihilar masses Electronically Signed   By: Leita Birmingham M.D.   On: 03/06/2024 11:05   DG C-Arm 1-60 Min-No Report Result Date: 03/06/2024 Fluoroscopy was utilized by the requesting physician.  No radiographic interpretation.    Labs:  CBC: Recent Labs    07/17/23 2033 10/16/23 1659 03/06/24 0751 03/13/24 1104  WBC 6.6 5.3 4.7 6.7  HGB 13.0 11.8* 11.7* 10.1*  HCT 39.9 36.8 36.4 30.0*  PLT 309 278 329 349     COAGS: No results for input(s): INR, APTT in the last 8760 hours.  BMP: Recent Labs    07/17/23 2033 10/16/23 1659 03/06/24 0751 03/13/24 1104  NA 141 140 139 141  K 3.9 3.6 4.2 4.0  CL 107 105 103 106  CO2 25 25 25 28   GLUCOSE 114* 93 86 100*  BUN 13 12 13 14   CALCIUM  9.2 8.9 9.1 8.6*  CREATININE 0.87 0.74 0.67 0.71  GFRNONAA >60 >60 >60 >60    LIVER FUNCTION TESTS: Recent Labs    07/17/23 2033 10/16/23 1659 03/13/24 1104  BILITOT 0.5 0.4 0.2  AST 15 16 14*  ALT 11 13 9   ALKPHOS 57 62 63  PROT 6.9 7.7 6.9  ALBUMIN 3.6 3.7 3.4*    TUMOR MARKERS: No results for input(s): AFPTM, CEA, CA199, CHROMGRNA in the last 8760 hours.  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 Without systemic therapy, median survival is approximately six months; with combination chemotherapy and immunotherapy, median survival is two years or longer. [...] - Ordered MRI brain to evaluate for intracranial metastasis prior to systemic therapy. - Ordered port placement for chemotherapy administration and topical anesthetic cream for port site care. - Initiate combination chemotherapy with carboplatin and paclitaxel every three weeks for four cycles.  Patient presents for scheduled Port-A-Cath placement in IR today.  ***Patient has been NPO since midnight in anticipation of moderate sedation. All labs and medications are within acceptable parameters.  Allergies reviewed: Ibuprofen.  Risks and benefits of image guided port-a-catheter placement was discussed with the patient including, but not limited to bleeding, infection, pneumothorax, or fibrin sheath development and need for additional procedures.  All of the patient's questions were answered, patient is agreeable to proceed. Consent signed and in  chart.     Thank you for allowing our service to participate in [facility] 's care.  Electronically Signed: Carlin DELENA Griffon, PA-C   03/26/2024, 10:00 PM      I spent a total of 30 Minutes in face to face in clinical consultation, greater than 50% of which was counseling/coordinating care for Port-A-Cath placement and assistance with management of chemotherapy administration for right lower lobe lung cancer.   "

## 2024-03-27 ENCOUNTER — Telehealth: Payer: Self-pay | Admitting: *Deleted

## 2024-03-27 ENCOUNTER — Other Ambulatory Visit (HOSPITAL_COMMUNITY)

## 2024-03-27 ENCOUNTER — Other Ambulatory Visit: Payer: Self-pay | Admitting: Physician Assistant

## 2024-03-27 ENCOUNTER — Inpatient Hospital Stay

## 2024-03-27 DIAGNOSIS — C3431 Malignant neoplasm of lower lobe, right bronchus or lung: Secondary | ICD-10-CM

## 2024-03-28 ENCOUNTER — Telehealth: Payer: Self-pay | Admitting: Internal Medicine

## 2024-03-28 ENCOUNTER — Other Ambulatory Visit: Payer: Self-pay | Admitting: Physician Assistant

## 2024-03-28 ENCOUNTER — Telehealth: Payer: Self-pay

## 2024-03-28 ENCOUNTER — Encounter: Payer: Self-pay | Admitting: Internal Medicine

## 2024-03-28 ENCOUNTER — Other Ambulatory Visit: Payer: Self-pay

## 2024-03-28 DIAGNOSIS — C3431 Malignant neoplasm of lower lobe, right bronchus or lung: Secondary | ICD-10-CM

## 2024-03-28 NOTE — Telephone Encounter (Signed)
 done

## 2024-03-28 NOTE — Telephone Encounter (Signed)
 Spoke with patient and confirmed appts for patient education on 04/04/24 @ 9 AM and first infusion on 04/13/24.  Advised patient to call if any changes with dental procedure appt to call our office.

## 2024-03-28 NOTE — Telephone Encounter (Signed)
 Called pt and left a voice mail informing them of there upcoming sched appt on 1/20

## 2024-03-28 NOTE — Telephone Encounter (Signed)
 Spoke with patient regarding upcoming dental appointment. Patient stated she needs to complete dental work prior to initiating treatment and has an appointment scheduled with [facility] on 04/06/24.  Spoke with Brittany at [employer], who stated the patient will undergo a full mouth exam, X-rays, and development of a dental treatment plan. Brittany was instructed to fax a medical clearance form detailing exactly what procedures will be performed or may potentially be performed on that date. Office fax number was provided. Brittany verbalized understanding.

## 2024-03-28 NOTE — Telephone Encounter (Signed)
 Faxed medical clearance form to [facility] with confirmation at [phone].

## 2024-03-29 ENCOUNTER — Encounter (HOSPITAL_COMMUNITY): Payer: Self-pay

## 2024-03-30 ENCOUNTER — Inpatient Hospital Stay: Admitting: Internal Medicine

## 2024-03-30 ENCOUNTER — Inpatient Hospital Stay

## 2024-03-31 ENCOUNTER — Encounter: Payer: Self-pay | Admitting: Internal Medicine

## 2024-03-31 NOTE — Telephone Encounter (Signed)
 Spoke with patient to confirm her appt on Tuesday 04/04/24 @ 9 AM for education.

## 2024-04-02 ENCOUNTER — Emergency Department (HOSPITAL_COMMUNITY)

## 2024-04-02 ENCOUNTER — Emergency Department (HOSPITAL_COMMUNITY)
Admission: EM | Admit: 2024-04-02 | Discharge: 2024-04-02 | Disposition: A | Discharge status: Home / Self Care | Attending: Emergency Medicine | Admitting: Emergency Medicine

## 2024-04-02 ENCOUNTER — Encounter (HOSPITAL_COMMUNITY): Payer: Self-pay

## 2024-04-02 ENCOUNTER — Other Ambulatory Visit: Payer: Self-pay

## 2024-04-02 DIAGNOSIS — Z85118 Personal history of other malignant neoplasm of bronchus and lung: Secondary | ICD-10-CM | POA: Diagnosis not present

## 2024-04-02 DIAGNOSIS — J939 Pneumothorax, unspecified: Secondary | ICD-10-CM | POA: Diagnosis not present

## 2024-04-02 DIAGNOSIS — R0602 Shortness of breath: Secondary | ICD-10-CM | POA: Diagnosis present

## 2024-04-02 LAB — CBC WITH DIFFERENTIAL/PLATELET
Abs Immature Granulocytes: 0.01 K/uL (ref 0.00–0.07)
Basophils Absolute: 0 K/uL (ref 0.0–0.1)
Basophils Relative: 1 %
Eosinophils Absolute: 0.1 K/uL (ref 0.0–0.5)
Eosinophils Relative: 1 %
HCT: 28.9 % — ABNORMAL LOW (ref 36.0–46.0)
Hemoglobin: 9.4 g/dL — ABNORMAL LOW (ref 12.0–15.0)
Immature Granulocytes: 0 %
Lymphocytes Relative: 20 %
Lymphs Abs: 1.2 K/uL (ref 0.7–4.0)
MCH: 26.6 pg (ref 26.0–34.0)
MCHC: 32.5 g/dL (ref 30.0–36.0)
MCV: 81.6 fL (ref 80.0–100.0)
Monocytes Absolute: 0.6 K/uL (ref 0.1–1.0)
Monocytes Relative: 11 %
Neutro Abs: 3.9 K/uL (ref 1.7–7.7)
Neutrophils Relative %: 67 %
Platelets: 418 K/uL — ABNORMAL HIGH (ref 150–400)
RBC: 3.54 MIL/uL — ABNORMAL LOW (ref 3.87–5.11)
RDW: 14.8 % (ref 11.5–15.5)
WBC: 5.8 K/uL (ref 4.0–10.5)
nRBC: 0 % (ref 0.0–0.2)

## 2024-04-02 LAB — COMPREHENSIVE METABOLIC PANEL WITH GFR
ALT: 9 U/L (ref 0–44)
AST: 15 U/L (ref 15–41)
Albumin: 3.3 g/dL — ABNORMAL LOW (ref 3.5–5.0)
Alkaline Phosphatase: 63 U/L (ref 38–126)
Anion gap: 9 (ref 5–15)
BUN: 15 mg/dL (ref 8–23)
CO2: 26 mmol/L (ref 22–32)
Calcium: 9 mg/dL (ref 8.9–10.3)
Chloride: 105 mmol/L (ref 98–111)
Creatinine, Ser: 0.63 mg/dL (ref 0.44–1.00)
GFR, Estimated: >60 mL/min
Glucose, Bld: 104 mg/dL — ABNORMAL HIGH (ref 70–99)
Potassium: 3.7 mmol/L (ref 3.5–5.1)
Sodium: 141 mmol/L (ref 135–145)
Total Bilirubin: <0.2 mg/dL (ref 0.0–1.2)
Total Protein: 7.2 g/dL (ref 6.5–8.1)

## 2024-04-02 MED ORDER — TRAMADOL HCL 50 MG PO TABS
50.0000 mg | ORAL_TABLET | Freq: Once | ORAL | Status: AC
  Administered 2024-04-02: 50 mg via ORAL
  Filled 2024-04-02: qty 1

## 2024-04-02 MED ORDER — MORPHINE SULFATE (PF) 4 MG/ML IV SOLN
4.0000 mg | Freq: Once | INTRAVENOUS | Status: DC
  Filled 2024-04-02: qty 1

## 2024-04-02 MED ORDER — OXYCODONE-ACETAMINOPHEN 5-325 MG PO TABS: 1.0000 | ORAL_TABLET | Freq: Four times a day (QID) | ORAL | 0 refills | Status: AC | PRN

## 2024-04-02 NOTE — Discharge Instructions (Addendum)
 Take Percocet as prescribed for management of pain that is not well-controlled by ibuprofen or Aleve .  Do not drive or drink alcohol after taking Percocet as it may make you drowsy and impair your judgment.  Follow-up in office with cardiothoracic surgery.  Call Monday morning to schedule a follow-up visit. Follow-up with your primary care doctor in the interim.  Return to the emergency department for new or concerning symptoms such as worsening shortness of breath, coughing up blood, loss of consciousness, fever.

## 2024-04-02 NOTE — ED Provider Notes (Signed)
 8:35 AM Care assumed at shift change from [place], PA-C at shift change.  In short, patient is a 64 year old female with known lung cancer presenting for pleuritic pain and shortness of breath onset yesterday.  Found to have a small to moderate pneumothorax.  Cardiothoracic surgery was consulted who recommended repeat imaging.  If no change in size of pneumothorax, advised no further emergent intervention and outpatient follow-up.  Patient's repeat chest x-ray appears stable.  She remains hemodynamically stable without hypoxia.  Oxygen saturations are 100% on room air.  Plan for discharge and referral to CTS.  Will ensure modalities for adequate pain control in the interim, pending outpatient assessment.  8:44 AM Patient sitting on the side of the bed, resting comfortably.  I have conveyed the results of her imaging.  She verbalizes understanding.  Comfortable with outpatient follow-up.  Plan for ambulation trial prior to discharge.  9:27 AM Per RN, patient ambulated over 100 feet. Patient showed no signs of distress and no need for any assistance. O2 sat remained above 97%, HR was between 87 and 93, RR was 27 to 33. When asked if she felt short of breath the pt stated No. I am use to walking fast.    Keith Sor, PA-C 04/02/24 9070    Franklyn Sid SAILOR, MD 04/02/24 1023

## 2024-04-02 NOTE — ED Triage Notes (Signed)
 Shortness of breath and left upper side pain that worsened about 2am. Says she woke up struggling to breathe. She used her albuterol inhaler and sat up until symptoms began to resolve.   Recently diagnosed with Stg IV lung cancer and unable to start Chemotherapy r/t dental issues.

## 2024-04-02 NOTE — ED Notes (Signed)
 Pt ambulated over 100 feet.  Pt showed no signs of distress and no need for any assistance.  O2 sat remained above 97,  Hr rate was between 87 and 93,  RR was 27 to 33.  When asked if she felt short of breath the pt stated NO.  I am use to walking fast.

## 2024-04-02 NOTE — ED Provider Notes (Signed)
 " [place] EMERGENCY DEPARTMENT AT [facility] Provider Note   CSN: [id number] Arrival date & time: 04/02/24  0236     Patient presents with: Shortness of Breath   Joanna Phelps is a 64 y.o. female.   Patient to ED with pleuritic pain and SOB that started yesterday and woke her up during the night with progressive symptoms. No cough or fever. Recent diagnosis of lung CA, not started on treatment yet. No vomiting. She used her inhaler with limited relief.  The history is provided by the patient. No language interpreter was used.  Shortness of Breath      Prior to Admission medications  Medication Sig Start Date End Date Taking? Authorizing Provider  amLODipine  (NORVASC ) 10 MG tablet Take 1 tablet (10 mg total) by mouth daily. Patient not taking: Reported on 02/15/2024 05/27/20   Celestia Rosaline SQUIBB, NP  atorvastatin  (LIPITOR) 20 MG tablet Take 1 tablet (20 mg total) by mouth daily. Patient not taking: Reported on 02/15/2024 05/27/20   Celestia Rosaline SQUIBB, NP  baclofen  (LIORESAL ) 10 MG tablet Take 0.5-1 tablets (5-10 mg total) by mouth at bedtime as needed for muscle spasms. Patient not taking: Reported on 02/15/2024 01/08/20   Hilts, Ozell, MD  buPROPion  (WELLBUTRIN  SR) 150 MG 12 hr tablet Take 1 tablet (150 mg total) by mouth 2 (two) times daily. Patient not taking: Reported on 02/15/2024 05/27/20   Celestia Rosaline SQUIBB, NP  diphenhydramine-acetaminophen  (TYLENOL  PM) 25-500 MG TABS tablet Take 1 tablet by mouth at bedtime as needed (sleep). Patient not taking: Reported on 02/15/2024    [provider]  doxycycline  (VIBRA -TABS) 100 MG tablet Take 1 tablet (100 mg total) by mouth 2 (two) times daily. 03/03/24   Geronimo Amel, MD  hydrochlorothiazide  (HYDRODIURIL ) 25 MG tablet Take 1 tablet (25 mg total) by mouth daily. Patient not taking: Reported on 02/15/2024 05/27/20   Celestia Rosaline SQUIBB, NP  lidocaine -prilocaine  (EMLA ) cream Apply to affected area once 03/13/24    Sherrod Sherrod, MD  loperamide  (IMODIUM ) 2 MG capsule Take 1 capsule (2 mg total) by mouth 4 (four) times daily as needed for diarrhea or loose stools. Patient not taking: Reported on 02/15/2024 07/17/23   Henderly, Britni A, PA-C  ondansetron  (ZOFRAN ) 8 MG tablet Take 1 tablet (8 mg total) by mouth every 8 (eight) hours as needed for nausea or vomiting. Start on the third day after carboplatin. 03/13/24   Sherrod Sherrod, MD  ondansetron  (ZOFRAN -ODT) 4 MG disintegrating tablet Take 1 tablet (4 mg total) by mouth every 8 (eight) hours as needed. Patient not taking: Reported on 02/15/2024 07/17/23   Henderly, Britni A, PA-C  prochlorperazine  (COMPAZINE ) 10 MG tablet Take 1 tablet (10 mg total) by mouth every 6 (six) hours as needed for nausea or vomiting. 03/13/24   Sherrod Sherrod, MD  traZODone (DESYREL) 100 MG tablet Take 100 mg by mouth at bedtime. 01/27/24   [provider]  VENTOLIN HFA 108 (90 Base) MCG/ACT inhaler Inhale 2 puffs into the lungs every 4 (four) hours as needed. 08/24/23   [provider]    Allergies: Ibuprofen    Review of Systems  Respiratory:  Positive for shortness of breath.     Updated Vital Signs BP 114/70   Pulse 62   Temp 97.7 F (36.5 C) (Oral)   Resp 16   Ht 5' 7 (1.702 m)   Wt 67.1 kg   LMP 10/18/2019   SpO2 100%   BMI 23.18 kg/m  Physical Exam Vitals and nursing note reviewed.  Constitutional:      Appearance: She is well-developed.  HENT:     Head: Normocephalic.  Cardiovascular:     Rate and Rhythm: Normal rate and regular rhythm.     Heart sounds: No murmur heard. Pulmonary:     Effort: Pulmonary effort is normal.     Breath sounds: No wheezing, rhonchi or rales.     Comments: Breath sounds diminished in left lower lobe. Abdominal:     General: Bowel sounds are normal.     Palpations: Abdomen is soft.     Tenderness: There is no abdominal tenderness. There is no guarding or rebound.  Musculoskeletal:         General: Normal range of motion.     Cervical back: Normal range of motion and neck supple.  Skin:    General: Skin is warm and dry.  Neurological:     General: No focal deficit present.     Mental Status: She is alert and oriented to person, place, and time.     (all labs ordered are listed, but only abnormal results are displayed) Labs Reviewed  CBC WITH DIFFERENTIAL/PLATELET - Abnormal; Notable for the following components:      Result Value   RBC 3.54 (*)    Hemoglobin 9.4 (*)    HCT 28.9 (*)    Platelets 418 (*)    All other components within normal limits  COMPREHENSIVE METABOLIC PANEL WITH GFR - Abnormal; Notable for the following components:   Glucose, Bld 104 (*)    Albumin 3.3 (*)    All other components within normal limits    EKG: EKG Interpretation Date/Time:  Sunday April 02 2024 02:45:29 EST Ventricular Rate:  77 PR Interval:  129 QRS Duration:  87 QT Interval:  366 QTC Calculation: 415 R Axis:   23  Text Interpretation: Sinus rhythm Normal ECG When compared with ECG of 10/16/2023, No significant change was found Confirmed by Raford Lenis ([id number]) on 04/02/2024 2:51:36 AM  Radiology: DG Chest 2 View Result Date: 04/02/2024 EXAM: 2 VIEW(S) XRAY OF THE CHEST 04/02/2024 03:30:47 AM COMPARISON: None available. CLINICAL HISTORY: Shortness of breath and left-sided chest pain. FINDINGS: LUNGS AND PLEURA: There is a new small left-sided pneumothorax identified both apically and along the lateral lung base. A mass lesion is again noted in the left base, although it is somewhat accentuated by the pneumothorax. A right perihilar mass with central cavitation is noted. Left-sided pleural effusion is noted as well. HEART AND MEDIASTINUM: Cardiac size and pulmonary vasculature are within normal limits. No acute abnormality of the cardiac and mediastinal silhouettes. BONES AND SOFT TISSUES: No acute osseous abnormality. IMPRESSION: 1. New small left-sided pneumothorax. 2.  Left-sided pleural effusion. 3. Right perihilar mass with central cavitation. 4. Left base mass lesion, accentuated by the pneumothorax. Findings were called to Dr. Raford at the time of exam interpretation. Electronically signed by: Oneil Devonshire MD 04/02/2024 03:36 AM EST RP Workstation: [id number]     Procedures   Medications Ordered in the ED  morphine  (PF) 4 MG/ML injection 4 mg (0 mg Intravenous Hold 04/02/24 0425)    Clinical Course as of 04/02/24 0637  Sun Apr 02, 2024  0629 Patient to ED with ss/sxs as per HPI. No hypoxia. VSS. She is in NAD. Pain addressed with morphine  with improvement. Labs are reassuring, hgb 9.4, c/w previous. CXR showing small PTX and new left pleural effusion.   Given her known left lower  neoplasm, the CXR findings were discussed with cardiothoracic on-call, Dr. Daniel, who advises to obtain a repeat CXR 4 hours after initial imaging. If stable, she can follow up in the outpatient setting. If increased, she will need inpatient management.  [SU]  [id number] Patient care signed out to oncoming provider.  [SU]    Clinical Course User Index [SU] Odell Balls, PA-C                                 Medical Decision Making Amount and/or Complexity of Data Reviewed Labs: ordered. Radiology: ordered.  Risk Prescription drug management.        Final diagnoses:  Pneumothorax, unspecified type  History of lung cancer    ED Discharge Orders     None          Odell Balls, DEVONNA 04/02/24 [id number]    Raford Lenis, MD 04/02/24 [phone]  "

## 2024-04-04 ENCOUNTER — Inpatient Hospital Stay

## 2024-04-06 ENCOUNTER — Inpatient Hospital Stay

## 2024-04-07 ENCOUNTER — Telehealth: Payer: Self-pay

## 2024-04-07 NOTE — Telephone Encounter (Signed)
 Pt called to inform Dr. Sherrod that she had a dental appointment today for teeth cleaning. Extensive cleaning was performed. Pt reports she was informed by the dentist that at least four teeth will need to be extracted. Next dental appointment is scheduled for March 3. Pt is inquiring whether any changes can be made to her current treatment plan so she may begin treatment at this time.

## 2024-04-10 NOTE — Progress Notes (Unsigned)
 [facility] OFFICE PROGRESS NOTE  Joanna Jerilyn HERO, FNP [address] [place] [address]  DIAGNOSIS: This is a very pleasant 64 years old African-American female with stage IV (T2b, N2, M1 A) non-small cell lung cancer, squamous cell carcinoma presented with large cavitary bilateral masses located in the left lower lobe as well as the right lower lobe with right infrahilar and subcarinal lymphadenopathy diagnosed in December 2025. Molecular studies and PD-L1 expression ***  PRIOR THERAPY: None   CURRENT THERAPY: Palliative systemic chemotherapy with carboplatin for an AUC of 5, paclitaxel 175 mg/m, and Libtayo 350 mg IV every 3 weeks.  First dose on 04/13/2024.  INTERVAL HISTORY: Joanna Phelps 64 y.o. female returns to clinic today for follow-up visit.  The patient was last seen in clinic on 12/25.  The patient was diagnosed with stage IV non-small cell lung cancer, squamous cell carcinoma.  Patient was supposed to start chemotherapy and immunotherapy in early January but related due to patient request due to some outstanding dental work.  She saw her dentist and she is not even expected to have any dental work until March 3.  She was seen in the interval at the emergency room for a moderate pneumothorax on 04/02/2024.  The plan is to ***.  She is expected to see pulmonary medicine on 02/24/2025.  The plan is to ***.   Patient denies any major changes in her health since she was last seen.  She denies any fever, chills, night sweats, or unexplained weight loss.  She denies any chest pain, shortness of breath, cough, or hemoptysis?  Signs of pneumothorax?  Check blood pressure?  She is following closely with social work.  She is also talk to member the nutritionist team.  She denies any nausea, vomiting, diarrhea, or constipation.  Denies any headache or visual changes.  Denies any rashes or skin changes.  She is here today for evaluation and repeat blood work before  undergoing cycle #1.    MEDICAL HISTORY: Past Medical History:  Diagnosis Date   Chlamydia    Hemorrhoids     ALLERGIES:  is allergic to ibuprofen.  MEDICATIONS:  Current Outpatient Medications  Medication Sig Dispense Refill   amLODipine  (NORVASC ) 10 MG tablet Take 1 tablet (10 mg total) by mouth daily. (Patient not taking: Reported on 02/15/2024) 90 tablet 3   atorvastatin  (LIPITOR) 20 MG tablet Take 1 tablet (20 mg total) by mouth daily. (Patient not taking: Reported on 02/15/2024) 90 tablet 3   baclofen  (LIORESAL ) 10 MG tablet Take 0.5-1 tablets (5-10 mg total) by mouth at bedtime as needed for muscle spasms. (Patient not taking: Reported on 02/15/2024) 30 each 3   buPROPion  (WELLBUTRIN  SR) 150 MG 12 hr tablet Take 1 tablet (150 mg total) by mouth 2 (two) times daily. (Patient not taking: Reported on 02/15/2024) 180 tablet 1   diphenhydramine-acetaminophen  (TYLENOL  PM) 25-500 MG TABS tablet Take 1 tablet by mouth at bedtime as needed (sleep). (Patient not taking: Reported on 02/15/2024)     doxycycline  (VIBRA -TABS) 100 MG tablet Take 1 tablet (100 mg total) by mouth 2 (two) times daily. 10 tablet 0   hydrochlorothiazide  (HYDRODIURIL ) 25 MG tablet Take 1 tablet (25 mg total) by mouth daily. (Patient not taking: Reported on 02/15/2024) 90 tablet 1   lidocaine -prilocaine  (EMLA ) cream Apply to affected area once 30 g 3   loperamide  (IMODIUM ) 2 MG capsule Take 1 capsule (2 mg total) by mouth 4 (four) times daily as needed  for diarrhea or loose stools. (Patient not taking: Reported on 02/15/2024) 12 capsule 0   ondansetron  (ZOFRAN ) 8 MG tablet Take 1 tablet (8 mg total) by mouth every 8 (eight) hours as needed for nausea or vomiting. Start on the third day after carboplatin. 30 tablet 1   ondansetron  (ZOFRAN -ODT) 4 MG disintegrating tablet Take 1 tablet (4 mg total) by mouth every 8 (eight) hours as needed. (Patient not taking: Reported on 02/15/2024) 20 tablet 0   oxyCODONE -acetaminophen   (PERCOCET/ROXICET) 5-325 MG tablet Take 1 tablet by mouth every 6 (six) hours as needed for severe pain (pain score 7-10). 15 tablet 0   prochlorperazine  (COMPAZINE ) 10 MG tablet Take 1 tablet (10 mg total) by mouth every 6 (six) hours as needed for nausea or vomiting. 30 tablet 1   traZODone (DESYREL) 100 MG tablet Take 100 mg by mouth at bedtime.     VENTOLIN HFA 108 (90 Base) MCG/ACT inhaler Inhale 2 puffs into the lungs every 4 (four) hours as needed.     No current facility-administered medications for this visit.    SURGICAL HISTORY:  Past Surgical History:  Procedure Laterality Date   Boil Lanced     BREAST BIOPSY Right 08/05/2023   MM RT BREAST BX W LOC DEV 1ST LESION IMAGE BX SPEC STEREO GUIDE 08/05/2023 [facility]   BREAST BIOPSY Right 08/05/2023   MM RT BREAST BX W LOC DEV EA AD LESION IMG BX SPEC STEREO GUIDE 08/05/2023 [facility]   BRONCHIAL BIOPSY  03/06/2024   Procedure: BRONCHOSCOPY, WITH BIOPSY;  Surgeon: Shelah Lamar RAMAN, MD;  Location: [facility];  Service: Pulmonary;;   BRONCHIAL NEEDLE ASPIRATION BIOPSY  03/06/2024   Procedure: BRONCHOSCOPY, WITH NEEDLE ASPIRATION BIOPSY;  Surgeon: Shelah Lamar RAMAN, MD;  Location: [facility];  Service: Pulmonary;;   VIDEO BRONCHOSCOPY WITH ENDOBRONCHIAL NAVIGATION N/A 03/06/2024   Procedure: VIDEO BRONCHOSCOPY WITH ENDOBRONCHIAL NAVIGATION;  Surgeon: Shelah Lamar RAMAN, MD;  Location: [facility];  Service: Pulmonary;  Laterality: N/A;    REVIEW OF SYSTEMS:   Review of Systems  Constitutional: Negative for appetite change, chills, fatigue, fever and unexpected weight change.  HENT:   Negative for mouth sores, nosebleeds, sore throat and trouble swallowing.   Eyes: Negative for eye problems and icterus.  Respiratory: Negative for cough, hemoptysis, shortness of breath and wheezing.   Cardiovascular: Negative for chest pain and leg swelling.  Gastrointestinal: Negative for abdominal pain, constipation, diarrhea, nausea  and vomiting.  Genitourinary: Negative for bladder incontinence, difficulty urinating, dysuria, frequency and hematuria.   Musculoskeletal: Negative for back pain, gait problem, neck pain and neck stiffness.  Skin: Negative for itching and rash.  Neurological: Negative for dizziness, extremity weakness, gait problem, headaches, light-headedness and seizures.  Hematological: Negative for adenopathy. Does not bruise/bleed easily.  Psychiatric/Behavioral: Negative for confusion, depression and sleep disturbance. The patient is not nervous/anxious.     PHYSICAL EXAMINATION:  Last menstrual period 10/18/2019.  ECOG PERFORMANCE STATUS: {CHL ONC ECOG [id number]  Physical Exam  Constitutional: Oriented to person, place, and time and well-developed, well-nourished, and in no distress. No distress.  HENT:  Head: Normocephalic and atraumatic.  Mouth/Throat: Oropharynx is clear and moist. No oropharyngeal exudate.  Eyes: Conjunctivae are normal. Right eye exhibits no discharge. Left eye exhibits no discharge. No scleral icterus.  Neck: Normal range of motion. Neck supple.  Cardiovascular: Normal rate, regular rhythm, normal heart sounds and intact distal pulses.   Pulmonary/Chest: Effort normal and breath sounds normal. No respiratory distress. No wheezes. No  rales.  Abdominal: Soft. Bowel sounds are normal. Exhibits no distension and no mass. There is no tenderness.  Musculoskeletal: Normal range of motion. Exhibits no edema.  Lymphadenopathy:    No cervical adenopathy.  Neurological: Alert and oriented to person, place, and time. Exhibits normal muscle tone. Gait normal. Coordination normal.  Skin: Skin is warm and dry. No rash noted. Not diaphoretic. No erythema. No pallor.  Psychiatric: Mood, memory and judgment normal.  Vitals reviewed.  LABORATORY DATA: Lab Results  Component Value Date   WBC 5.8 04/02/2024   HGB 9.4 (L) 04/02/2024   HCT 28.9 (L) 04/02/2024   MCV 81.6 04/02/2024    PLT 418 (H) 04/02/2024      Chemistry      Component Value Date/Time   NA 141 04/02/2024 0406   NA 141 12/21/2019 1147   K 3.7 04/02/2024 0406   CL 105 04/02/2024 0406   CO2 26 04/02/2024 0406   BUN 15 04/02/2024 0406   BUN 10 12/21/2019 1147   CREATININE 0.63 04/02/2024 0406   CREATININE 0.71 03/13/2024 1104      Component Value Date/Time   CALCIUM  9.0 04/02/2024 0406   ALKPHOS 63 04/02/2024 0406   AST 15 04/02/2024 0406   AST 14 (L) 03/13/2024 1104   ALT 9 04/02/2024 0406   ALT 9 03/13/2024 1104   BILITOT <0.2 04/02/2024 0406   BILITOT 0.2 03/13/2024 1104       RADIOGRAPHIC STUDIES:  DG Chest 2 View Result Date: 04/02/2024 EXAM: 2 VIEW(S) XRAY OF THE CHEST 04/02/2024 08:04:00 AM COMPARISON: 04/02/2024 CLINICAL HISTORY: PTX; f/u FINDINGS: LUNGS AND PLEURA: Stable small to moderate left pneumothorax. Unchanged appearance of left lower lobe and right lower lobe perihilar mass. No pleural effusion. HEART AND MEDIASTINUM: No acute abnormality of the cardiac and mediastinal silhouettes. BONES AND SOFT TISSUES: No acute osseous abnormality. IMPRESSION: 1. Stable small to moderate left pneumothorax. Electronically signed by: Waddell Calk MD 04/02/2024 08:28 AM EST RP Workstation: [id number]   DG Chest 2 View Result Date: 04/02/2024 EXAM: 2 VIEW(S) XRAY OF THE CHEST 04/02/2024 03:30:47 AM COMPARISON: None available. CLINICAL HISTORY: Shortness of breath and left-sided chest pain. FINDINGS: LUNGS AND PLEURA: There is a new small left-sided pneumothorax identified both apically and along the lateral lung base. A mass lesion is again noted in the left base, although it is somewhat accentuated by the pneumothorax. A right perihilar mass with central cavitation is noted. Left-sided pleural effusion is noted as well. HEART AND MEDIASTINUM: Cardiac size and pulmonary vasculature are within normal limits. No acute abnormality of the cardiac and mediastinal silhouettes. BONES AND SOFT TISSUES:  No acute osseous abnormality. IMPRESSION: 1. New small left-sided pneumothorax. 2. Left-sided pleural effusion. 3. Right perihilar mass with central cavitation. 4. Left base mass lesion, accentuated by the pneumothorax. Findings were called to Dr. Raford at the time of exam interpretation. Electronically signed by: Oneil Devonshire MD 04/02/2024 03:36 AM EST RP Workstation: [id number]   MR BRAIN W WO CONTRAST Result Date: 03/28/2024 EXAM: MRI BRAIN WITH AND WITHOUT CONTRAST 03/21/2024 03:28:32 PM TECHNIQUE: Multiplanar multisequence MRI of the head/brain was performed with and without the administration of intravenous contrast. CONTRAST: 6.5 mL of gadobutrol  (GADAVIST ) 1 MMOL/ML injection. COMPARISON: None available. CLINICAL HISTORY: Non-small cell lung cancer (NSCLC), staging. Dx: Malignant neoplasm of unspecified part of unspecified bronchus or lung (HCC) - C34.90. FINDINGS: BRAIN AND VENTRICLES: No acute infarct. No acute intracranial hemorrhage. No mass effect or midline shift. No hydrocephalus. The sella is unremarkable.  Normal flow voids. No abnormal intracranial enhancement. There is mild subcortical and periventricular T2 and FLAIR signal hyperintensity likely reflecting sequelae of chronic microvascular ischemia. ORBITS: No acute abnormality. SINUSES: No acute abnormality. BONES AND SOFT TISSUES: Normal bone marrow signal and enhancement. No acute soft tissue abnormality. IMPRESSION: 1. No evidence of intracranial metastatic disease. 2. No acute intracranial abnormality. Electronically signed by: Prentice Spade MD 03/28/2024 12:31 PM EST RP Workstation: [id number]     ASSESSMENT/PLAN:  This is a very pleasant 64 year old African-American female with stage IV (T2b, N2, M1 A) non-small cell lung cancer, squamous cell carcinoma presented with large cavitary bilateral masses located in the left lower lobe as well as the right lower lobe with right infrahilar and subcarinal lymphadenopathy diagnosed in  December 2025.   Molecular studies ***  ***PDL1****  She is expected to start palliative systemic chemotherapy with carboplatin for an AUC of 5, paclitaxel 175 mg/m, Libtayo 350 mg IV every 3 weeks.  Her first dose is expected today.  She is expected to have dental work on 05/16/2024.  Would recommend ***    No orders of the defined types were placed in this encounter.    I spent {CHL ONC TIME VISIT - DTPQU:[id number]} counseling the patient face to face. The total time spent in the appointment was {CHL ONC TIME VISIT - DTPQU:[id number]}.  Kylin Dubs L Armstrong Creasy, PA-C 04/10/24

## 2024-04-11 ENCOUNTER — Telehealth: Payer: Self-pay | Admitting: Medical Oncology

## 2024-04-11 NOTE — Telephone Encounter (Signed)
 I told pt to keep her appts on Thursday.  ON 1/23 Dr. Ramtin Deljouei, DDS could not complete her mouth exam due to calculus . He did a full mouth debridement .This had to be done so  can exam her teeth. They will try to see her before March 6th if someone cancels.  Pt reiterated that  she needs to have 4 teeth extracted and she may need surgery to remove them.  Received dental note.  dental note.

## 2024-04-12 MED FILL — Fosaprepitant Dimeglumine For IV Infusion 150 MG (Base Eq): INTRAVENOUS | Qty: 5 | Status: AC

## 2024-04-13 ENCOUNTER — Other Ambulatory Visit: Payer: Self-pay | Admitting: Physician Assistant

## 2024-04-13 ENCOUNTER — Inpatient Hospital Stay

## 2024-04-13 ENCOUNTER — Inpatient Hospital Stay: Admitting: Nutrition

## 2024-04-13 ENCOUNTER — Telehealth: Payer: Self-pay

## 2024-04-13 ENCOUNTER — Inpatient Hospital Stay: Admitting: Physician Assistant

## 2024-04-13 VITALS — BP 129/72 | HR 74 | Temp 98.0°F | Resp 16 | Ht 67.0 in | Wt 139.6 lb

## 2024-04-13 DIAGNOSIS — R634 Abnormal weight loss: Secondary | ICD-10-CM | POA: Diagnosis not present

## 2024-04-13 DIAGNOSIS — C3432 Malignant neoplasm of lower lobe, left bronchus or lung: Secondary | ICD-10-CM | POA: Diagnosis present

## 2024-04-13 DIAGNOSIS — C3431 Malignant neoplasm of lower lobe, right bronchus or lung: Secondary | ICD-10-CM

## 2024-04-13 DIAGNOSIS — D649 Anemia, unspecified: Secondary | ICD-10-CM | POA: Diagnosis not present

## 2024-04-13 LAB — CBC WITH DIFFERENTIAL (CANCER CENTER ONLY)
Abs Immature Granulocytes: 0.01 10*3/uL (ref 0.00–0.07)
Basophils Absolute: 0.1 10*3/uL (ref 0.0–0.1)
Basophils Relative: 1 %
Eosinophils Absolute: 0.4 10*3/uL (ref 0.0–0.5)
Eosinophils Relative: 5 %
HCT: 29.4 % — ABNORMAL LOW (ref 36.0–46.0)
Hemoglobin: 9.7 g/dL — ABNORMAL LOW (ref 12.0–15.0)
Immature Granulocytes: 0 %
Lymphocytes Relative: 24 %
Lymphs Abs: 1.6 10*3/uL (ref 0.7–4.0)
MCH: 25.7 pg — ABNORMAL LOW (ref 26.0–34.0)
MCHC: 33 g/dL (ref 30.0–36.0)
MCV: 78 fL — ABNORMAL LOW (ref 80.0–100.0)
Monocytes Absolute: 0.6 10*3/uL (ref 0.1–1.0)
Monocytes Relative: 9 %
Neutro Abs: 4.1 10*3/uL (ref 1.7–7.7)
Neutrophils Relative %: 61 %
Platelet Count: 424 10*3/uL — ABNORMAL HIGH (ref 150–400)
RBC: 3.77 MIL/uL — ABNORMAL LOW (ref 3.87–5.11)
RDW: 15.7 % — ABNORMAL HIGH (ref 11.5–15.5)
WBC Count: 6.7 10*3/uL (ref 4.0–10.5)
nRBC: 0 % (ref 0.0–0.2)

## 2024-04-13 LAB — CMP (CANCER CENTER ONLY)
ALT: 10 U/L (ref 0–44)
AST: 16 U/L (ref 15–41)
Albumin: 3.9 g/dL (ref 3.5–5.0)
Alkaline Phosphatase: 66 U/L (ref 38–126)
Anion gap: 11 (ref 5–15)
BUN: 13 mg/dL (ref 8–23)
CO2: 27 mmol/L (ref 22–32)
Calcium: 9.3 mg/dL (ref 8.9–10.3)
Chloride: 103 mmol/L (ref 98–111)
Creatinine: 0.76 mg/dL (ref 0.44–1.00)
GFR, Estimated: >60 mL/min
Glucose, Bld: 101 mg/dL — ABNORMAL HIGH (ref 70–99)
Potassium: 4 mmol/L (ref 3.5–5.1)
Sodium: 141 mmol/L (ref 135–145)
Total Bilirubin: <0.2 mg/dL (ref 0.0–1.2)
Total Protein: 7.6 g/dL (ref 6.5–8.1)

## 2024-04-13 LAB — FERRITIN: Ferritin: 241 ng/mL (ref 11–307)

## 2024-04-13 LAB — IRON AND IRON BINDING CAPACITY (CC-WL,HP ONLY)
Iron: 31 ug/dL (ref 28–170)
Saturation Ratios: 14 % (ref 10.4–31.8)
TIBC: 217 ug/dL — ABNORMAL LOW (ref 250–450)
UIBC: 186 ug/dL

## 2024-04-13 LAB — FOLATE: Folate: 9.8 ng/mL

## 2024-04-13 LAB — TSH: TSH: 1.46 u[IU]/mL (ref 0.350–4.500)

## 2024-04-13 LAB — VITAMIN B12: Vitamin B-12: 292 pg/mL (ref 180–914)

## 2024-04-13 NOTE — Telephone Encounter (Signed)
 Pt called to inform MD that her appt for dental work has been moved to April 21, 2024.

## 2024-04-13 NOTE — Telephone Encounter (Signed)
 Spoke with Chelsea at [employer] regarding moving the patients dental surgery appointment to an earlier date than 05/04/24. Chelsea stated that the provider is able to schedule the patient for 04/20/24 at 0800 AM. Mitzie will contact the patient to confirm the appointment.

## 2024-04-14 ENCOUNTER — Encounter: Payer: Self-pay | Admitting: Internal Medicine

## 2024-04-14 LAB — T4: T4, Total: 8.2 ug/dL (ref 4.5–12.0)

## 2024-04-15 ENCOUNTER — Inpatient Hospital Stay

## 2024-04-18 ENCOUNTER — Other Ambulatory Visit: Payer: Self-pay | Admitting: Physician Assistant

## 2024-04-20 ENCOUNTER — Inpatient Hospital Stay

## 2024-04-27 ENCOUNTER — Ambulatory Visit

## 2024-04-27 ENCOUNTER — Encounter (HOSPITAL_BASED_OUTPATIENT_CLINIC_OR_DEPARTMENT_OTHER)

## 2024-04-28 ENCOUNTER — Inpatient Hospital Stay

## 2024-05-04 ENCOUNTER — Inpatient Hospital Stay: Attending: Internal Medicine

## 2024-05-04 ENCOUNTER — Inpatient Hospital Stay: Admitting: Internal Medicine

## 2024-05-04 ENCOUNTER — Inpatient Hospital Stay

## 2024-05-04 ENCOUNTER — Inpatient Hospital Stay: Admitting: Nutrition

## 2024-05-06 ENCOUNTER — Inpatient Hospital Stay

## 2024-05-11 ENCOUNTER — Inpatient Hospital Stay

## 2024-05-18 ENCOUNTER — Inpatient Hospital Stay: Attending: Internal Medicine

## 2024-05-25 ENCOUNTER — Inpatient Hospital Stay: Admitting: Physician Assistant

## 2024-05-25 ENCOUNTER — Inpatient Hospital Stay

## 2024-05-26 ENCOUNTER — Inpatient Hospital Stay

## 2024-05-26 ENCOUNTER — Inpatient Hospital Stay: Admitting: Physician Assistant

## 2024-05-27 ENCOUNTER — Inpatient Hospital Stay

## 2024-06-15 ENCOUNTER — Inpatient Hospital Stay: Admitting: Internal Medicine

## 2024-06-15 ENCOUNTER — Inpatient Hospital Stay

## 2024-06-15 ENCOUNTER — Inpatient Hospital Stay: Attending: Internal Medicine

## 2024-06-17 ENCOUNTER — Inpatient Hospital Stay

## 2024-07-06 ENCOUNTER — Inpatient Hospital Stay

## 2024-07-06 ENCOUNTER — Inpatient Hospital Stay: Admitting: Internal Medicine

## 2024-07-08 ENCOUNTER — Inpatient Hospital Stay
